# Patient Record
Sex: Male | Born: 1965
Health system: Southern US, Community
[De-identification: ages and names within clinical notes are randomized; demographics above are authoritative.]

## PROBLEM LIST (undated history)

## (undated) DIAGNOSIS — F419 Anxiety disorder, unspecified: Secondary | ICD-10-CM

## (undated) DIAGNOSIS — E785 Hyperlipidemia, unspecified: Secondary | ICD-10-CM

## (undated) DIAGNOSIS — K219 Gastro-esophageal reflux disease without esophagitis: Secondary | ICD-10-CM

## (undated) DIAGNOSIS — R51 Headache: Secondary | ICD-10-CM

## (undated) DIAGNOSIS — R519 Headache, unspecified: Secondary | ICD-10-CM

## (undated) HISTORY — DX: Anxiety disorder, unspecified: F41.9

## (undated) HISTORY — DX: Headache: R51

## (undated) HISTORY — DX: Headache, unspecified: R51.9

## (undated) HISTORY — DX: Gastro-esophageal reflux disease without esophagitis: K21.9

## (undated) HISTORY — PX: GUM SURGERY: SHX658

## (undated) HISTORY — DX: Hyperlipidemia, unspecified: E78.5

---

## 2002-08-03 ENCOUNTER — Encounter: Payer: Self-pay | Admitting: Emergency Medicine

## 2002-08-03 ENCOUNTER — Emergency Department (HOSPITAL_COMMUNITY): Admission: EM | Admit: 2002-08-03 | Discharge: 2002-08-03 | Payer: Self-pay | Admitting: Emergency Medicine

## 2003-08-26 ENCOUNTER — Emergency Department (HOSPITAL_COMMUNITY): Admission: EM | Admit: 2003-08-26 | Discharge: 2003-08-26 | Payer: Self-pay | Admitting: Emergency Medicine

## 2003-08-30 ENCOUNTER — Emergency Department (HOSPITAL_COMMUNITY): Admission: EM | Admit: 2003-08-30 | Discharge: 2003-08-30 | Payer: Self-pay | Admitting: Emergency Medicine

## 2004-02-09 ENCOUNTER — Ambulatory Visit (HOSPITAL_COMMUNITY): Admission: RE | Admit: 2004-02-09 | Discharge: 2004-02-09 | Payer: Self-pay | Admitting: Internal Medicine

## 2004-02-09 ENCOUNTER — Ambulatory Visit (HOSPITAL_COMMUNITY): Admission: RE | Admit: 2004-02-09 | Discharge: 2004-02-09 | Payer: Self-pay | Admitting: Gastroenterology

## 2005-10-01 ENCOUNTER — Encounter: Admission: RE | Admit: 2005-10-01 | Discharge: 2005-11-19 | Payer: Self-pay | Admitting: Family Medicine

## 2011-02-05 ENCOUNTER — Other Ambulatory Visit: Payer: Self-pay | Admitting: Obstetrics and Gynecology

## 2011-02-05 DIAGNOSIS — M5416 Radiculopathy, lumbar region: Secondary | ICD-10-CM

## 2011-02-07 ENCOUNTER — Ambulatory Visit
Admission: RE | Admit: 2011-02-07 | Discharge: 2011-02-07 | Disposition: A | Discharge status: Home / Self Care | Payer: BC Managed Care – PPO | Source: Ambulatory Visit | Attending: Obstetrics and Gynecology | Admitting: Obstetrics and Gynecology

## 2011-02-07 DIAGNOSIS — M5416 Radiculopathy, lumbar region: Secondary | ICD-10-CM

## 2013-03-05 ENCOUNTER — Other Ambulatory Visit: Payer: Self-pay | Admitting: Gastroenterology

## 2014-08-16 ENCOUNTER — Encounter: Payer: Self-pay | Admitting: *Deleted

## 2014-08-17 ENCOUNTER — Ambulatory Visit (INDEPENDENT_AMBULATORY_CARE_PROVIDER_SITE_OTHER): Payer: 59 | Admitting: Cardiovascular Disease

## 2014-08-17 ENCOUNTER — Encounter: Payer: Self-pay | Admitting: Cardiovascular Disease

## 2014-08-17 VITALS — BP 120/82 | HR 80 | Ht 68.5 in | Wt 188.1 lb

## 2014-08-17 DIAGNOSIS — F419 Anxiety disorder, unspecified: Secondary | ICD-10-CM

## 2014-08-17 DIAGNOSIS — R0789 Other chest pain: Secondary | ICD-10-CM

## 2014-08-17 NOTE — Patient Instructions (Addendum)
Your physician recommends that you continue on your current medications as directed. Please refer to the Current Medication list given to you today.  Your physician has requested that you have an exercise tolerance test. For further information please visit [email or web address]. Please also follow instruction sheet, as given.   Your physician recommends that you schedule a follow-up appointment in: as needed with Dr. Elease HashimotoNahser.

## 2014-08-17 NOTE — Progress Notes (Signed)
Cardiology Office Note   Date:  08/17/2014   ID:  Randy Nash, DOB 1966-02-06, MRN [id number]  PCP:   Duane Lopeoss, Alan, MD  Cardiologist:   Elease HashimotoNahser, Deloris PingPhilip J, MD   Chief Complaint  Patient presents with  . OTHER    chest pain       History of Present Illness: Randy Nash is a 49 y.o. male who presents for evaluation of chest pain.  He is typically a healthy, active man.   He went out for a bike ride during lunch.  Had severe fatigue and abdominal pain.  Also had some muscle tension  The chest tension seemed to last for about a week.  He has anxiety which seems to cause some his symptoms He rode the next weekend - 27 miles at a moderate - aggressive pace with  Rides 1000-1500 miles a year  Has had a stress test in 2006 which was normal.    He works as a Dealermechanical engineer ( designs airline seats.)    Past Medical History  Diagnosis Date  . GERD (gastroesophageal reflux disease)   . Headache   . Anxiety     Past Surgical History  Procedure Laterality Date  . Gum surgery       Current Outpatient Prescriptions  Medication Sig Dispense Refill  . Ascorbic Acid (VITAMIN C) 100 MG CHEW Chew 1 tablet by mouth daily.    Marland Kitchen. B-Complex TABS Take 1 tablet by mouth daily.    Marland Kitchen. esomeprazole (NEXIUM) 10 MG packet Take 10 mg by mouth 2 (two) times daily.    . Ginseng (ELEUTHERO PO) Take 2 capsules by mouth 2 (two) times daily.    . magnesium oxide (MAG-OX) 400 MG tablet Take 400 mg by mouth daily.    . Vitamin E 100 UNITS TABS Take 1 tablet by mouth daily.     No current facility-administered medications for this visit.    Allergies:   Review of patient's allergies indicates no known allergies.    Social History:  The patient  reports that he has never smoked. He does not have any smokeless tobacco history on file. He reports that he drinks alcohol. He reports that he does not use illicit drugs.   Family History:  The patient's family history includes Anemia in  his father; Cancer in his mother.    ROS:  Please see the history of present illness.   Otherwise, review of systems are positive for none.   All other systems are reviewed and negative.    PHYSICAL EXAM: VS:  BP 120/82 mmHg  Pulse 80  Ht 5' 8.5" (1.74 m)  Wt 188 lb 1.9 oz (85.331 kg)  BMI 28.18 kg/m2 , BMI Body mass index is 28.18 kg/(m^2). GEN: Well nourished, well developed, in no acute distress HEENT: normal Neck: no JVD, carotid bruits, or masses Cardiac: RRR; no murmurs, rubs, or gallops,no edema  Respiratory:  clear to auscultation bilaterally, normal work of breathing GI: soft, nontender, nondistended, + BS MS: no deformity or atrophy Skin: warm and dry, no rash Neuro:  Strength and sensation are intact Psych: euthymic mood, full affect   EKG:  EKG is not ordered today.    Recent Labs: No results found for requested labs within last 365 days.    Lipid Panel No results found for: CHOL, TRIG, HDL, CHOLHDL, VLDL, LDLCALC, LDLDIRECT    Wt Readings from Last 3 Encounters:  08/17/14 188 lb 1.9 oz (85.331 kg)  Other studies Reviewed: Additional studies/ records that were reviewed today include: records from Dr. Tenny Craw.. Review of the above records demonstrates:   Patient has had some CP shortly after exercise  ECG from Dr. Charlott Rakes office reviewed:  NSR , no ST or T wave changes.    ASSESSMENT AND PLAN:  1.  Atypical chest pain:  His CP is very atypical . He is an avid cyclist and rides approximately 1000 -  1500 miles per year. He was able to ride on a 27 mile bike ride a week after this episode of chest pain and he did so without any chest pain or shortness breath.  I think that his symptoms are very atypical. They're more consistent with anxiety. I will get a standard Bruce protocol GXT.   His resting ECG is normal.     I've given him my card and he will call me if he has any recurrent episodes of chest discomfort.   Current medicines are reviewed at  length with the patient today.  The patient does not have concerns regarding medicines.  The following changes have been made:  no change  Labs/ tests ordered today include:  No orders of the defined types were placed in this encounter.    Disposition:   FU with me  As needed.    Signed, Nahser, Deloris Ping, MD  08/17/2014 10:22 AM    [facility] [address] [place], [place], [place]  [address] Phone: [phone]; Fax: [phone]

## 2014-08-26 ENCOUNTER — Encounter: Payer: Self-pay | Admitting: Obstetrics and Gynecology

## 2014-10-15 ENCOUNTER — Ambulatory Visit: Payer: 59 | Admitting: Nurse Practitioner

## 2014-10-18 ENCOUNTER — Ambulatory Visit: Payer: 59 | Admitting: Physician Assistant

## 2014-11-16 ENCOUNTER — Ambulatory Visit (INDEPENDENT_AMBULATORY_CARE_PROVIDER_SITE_OTHER): Payer: 59 | Admitting: Nurse Practitioner

## 2014-11-16 ENCOUNTER — Ambulatory Visit: Payer: 59 | Admitting: Nurse Practitioner

## 2014-11-16 ENCOUNTER — Encounter: Payer: 59 | Admitting: Nurse Practitioner

## 2014-11-16 ENCOUNTER — Encounter: Payer: Self-pay | Admitting: Nurse Practitioner

## 2014-11-16 DIAGNOSIS — R0789 Other chest pain: Secondary | ICD-10-CM | POA: Diagnosis not present

## 2014-11-16 NOTE — Progress Notes (Signed)
Exercise Treadmill Test  Pre-Exercise Testing Evaluation Rhythm: normal sinus  Rate: 65 bpm     Test  Exercise Tolerance Test Ordering MD: Kristeen MissPhilip Nahser, MD  Interpreting MD: Norma FredricksonLori Gerhardt, NP  Unique Test No: 1  Treadmill:  1  Indication for ETT: chest pain - rule out ischemia  Contraindication to ETT: No   Stress Modality: exercise - treadmill  Cardiac Imaging Performed: non   Protocol: standard Bruce - maximal  Max BP:  163/79  Max MPHR (bpm):  172 85% MPR (bpm):  146  MPHR obtained (bpm):  157 % MPHR obtained:  91%  Reached 85% MPHR (min:sec):  7:35 Total Exercise Time (min-sec):  10 minutes  Workload in METS:  11.6 Borg Scale: 16  Reason ETT Terminated:  patient's desire to stop    ST Segment Analysis At Rest: normal ST segments - no evidence of significant ST depression With Exercise: no evidence of significant ST depression  Other Information Arrhythmia:  No Angina during ETT:  absent (0) Quality of ETT:  diagnostic  ETT Interpretation:  normal - no evidence of ischemia by ST analysis  Comments: Patient presents today for routine GXT. Has had atypical chest pain/fatigue. He exercises very vigorously. His symptoms have improved since he was last seen.   Today the patient exercised on the standard Bruce protocol for a total of 10 minutes.  Good exercise tolerance.  Adequate blood pressure response.  Clinically negative for chest pain. Test was stopped due to achievement of target HR/patient's desire to stop.  EKG negative for ischemia. No significant arrhythmia noted.    Recommendations: CV risk factor modification  See back prn  Patient is agreeable to this plan and will call if any problems develop in the interim.   Rosalio MacadamiaLori C. Gerhardt, RN, ANP-C [facility] [address] [place], [place]  [id number] [phone]

## 2016-09-06 DIAGNOSIS — R12 Heartburn: Secondary | ICD-10-CM | POA: Diagnosis not present

## 2016-09-06 DIAGNOSIS — Z8371 Family history of colonic polyps: Secondary | ICD-10-CM | POA: Diagnosis not present

## 2016-09-21 DIAGNOSIS — Z1211 Encounter for screening for malignant neoplasm of colon: Secondary | ICD-10-CM | POA: Diagnosis not present

## 2016-09-21 DIAGNOSIS — R12 Heartburn: Secondary | ICD-10-CM | POA: Diagnosis not present

## 2016-09-21 DIAGNOSIS — K2930 Chronic superficial gastritis without bleeding: Secondary | ICD-10-CM | POA: Diagnosis not present

## 2016-09-21 DIAGNOSIS — K3189 Other diseases of stomach and duodenum: Secondary | ICD-10-CM | POA: Diagnosis not present

## 2016-09-21 DIAGNOSIS — Z8371 Family history of colonic polyps: Secondary | ICD-10-CM | POA: Diagnosis not present

## 2016-11-12 DIAGNOSIS — Z0000 Encounter for general adult medical examination without abnormal findings: Secondary | ICD-10-CM | POA: Diagnosis not present

## 2016-11-19 DIAGNOSIS — Z0000 Encounter for general adult medical examination without abnormal findings: Secondary | ICD-10-CM | POA: Diagnosis not present

## 2017-08-15 DIAGNOSIS — H938X3 Other specified disorders of ear, bilateral: Secondary | ICD-10-CM | POA: Diagnosis not present

## 2017-08-15 DIAGNOSIS — R05 Cough: Secondary | ICD-10-CM | POA: Diagnosis not present

## 2017-08-15 DIAGNOSIS — J209 Acute bronchitis, unspecified: Secondary | ICD-10-CM | POA: Diagnosis not present

## 2017-12-24 DIAGNOSIS — Z0000 Encounter for general adult medical examination without abnormal findings: Secondary | ICD-10-CM | POA: Diagnosis not present

## 2017-12-24 DIAGNOSIS — Z13220 Encounter for screening for lipoid disorders: Secondary | ICD-10-CM | POA: Diagnosis not present

## 2017-12-27 DIAGNOSIS — Z0000 Encounter for general adult medical examination without abnormal findings: Secondary | ICD-10-CM | POA: Diagnosis not present

## 2018-02-25 ENCOUNTER — Ambulatory Visit: Payer: Self-pay | Admitting: Allergy and Immunology

## 2019-05-06 ENCOUNTER — Other Ambulatory Visit: Payer: Self-pay

## 2019-05-06 DIAGNOSIS — Z20822 Contact with and (suspected) exposure to covid-19: Secondary | ICD-10-CM

## 2019-05-07 LAB — NOVEL CORONAVIRUS, NAA: SARS-CoV-2, NAA: NOT DETECTED

## 2019-09-03 ENCOUNTER — Other Ambulatory Visit: Payer: Self-pay

## 2019-09-03 ENCOUNTER — Emergency Department (HOSPITAL_BASED_OUTPATIENT_CLINIC_OR_DEPARTMENT_OTHER)
Admission: EM | Admit: 2019-09-03 | Discharge: 2019-09-03 | Disposition: A | Discharge status: Home / Self Care | Payer: Managed Care, Other (non HMO) | Attending: Emergency Medicine | Admitting: Emergency Medicine

## 2019-09-03 ENCOUNTER — Encounter (HOSPITAL_BASED_OUTPATIENT_CLINIC_OR_DEPARTMENT_OTHER): Payer: Self-pay | Admitting: *Deleted

## 2019-09-03 ENCOUNTER — Emergency Department (HOSPITAL_BASED_OUTPATIENT_CLINIC_OR_DEPARTMENT_OTHER): Payer: Managed Care, Other (non HMO)

## 2019-09-03 DIAGNOSIS — Z79899 Other long term (current) drug therapy: Secondary | ICD-10-CM | POA: Diagnosis not present

## 2019-09-03 DIAGNOSIS — Z7982 Long term (current) use of aspirin: Secondary | ICD-10-CM | POA: Diagnosis not present

## 2019-09-03 DIAGNOSIS — U071 COVID-19: Secondary | ICD-10-CM | POA: Insufficient documentation

## 2019-09-03 DIAGNOSIS — R079 Chest pain, unspecified: Secondary | ICD-10-CM

## 2019-09-03 DIAGNOSIS — R0602 Shortness of breath: Secondary | ICD-10-CM | POA: Diagnosis present

## 2019-09-03 LAB — BASIC METABOLIC PANEL
Anion gap: 9 (ref 5–15)
BUN: 10 mg/dL (ref 6–20)
CO2: 24 mmol/L (ref 22–32)
Calcium: 8.8 mg/dL — ABNORMAL LOW (ref 8.9–10.3)
Chloride: 104 mmol/L (ref 98–111)
Creatinine, Ser: 0.94 mg/dL (ref 0.61–1.24)
GFR calc Af Amer: >60 mL/min (ref >60)
GFR calc non Af Amer: >60 mL/min (ref >60)
Glucose, Bld: 102 mg/dL — ABNORMAL HIGH (ref 70–99)
Potassium: 4.3 mmol/L (ref 3.5–5.1)
Sodium: 137 mmol/L (ref 135–145)

## 2019-09-03 LAB — CBC WITH DIFFERENTIAL/PLATELET
Abs Immature Granulocytes: 0.03 10*3/uL (ref 0.00–0.07)
Basophils Absolute: 0 10*3/uL (ref 0.0–0.1)
Basophils Relative: 0 %
Eosinophils Absolute: 0 10*3/uL (ref 0.0–0.5)
Eosinophils Relative: 0 %
HCT: 45.4 % (ref 39.0–52.0)
Hemoglobin: 14.8 g/dL (ref 13.0–17.0)
Immature Granulocytes: 1 %
Lymphocytes Relative: 19 %
Lymphs Abs: 1 10*3/uL (ref 0.7–4.0)
MCH: 24.4 pg — ABNORMAL LOW (ref 26.0–34.0)
MCHC: 32.6 g/dL (ref 30.0–36.0)
MCV: 74.9 fL — ABNORMAL LOW (ref 80.0–100.0)
Monocytes Absolute: 0.4 10*3/uL (ref 0.1–1.0)
Monocytes Relative: 7 %
Neutro Abs: 3.7 10*3/uL (ref 1.7–7.7)
Neutrophils Relative %: 73 %
Platelets: 178 10*3/uL (ref 150–400)
RBC: 6.06 MIL/uL — ABNORMAL HIGH (ref 4.22–5.81)
RDW: 13.6 % (ref 11.5–15.5)
WBC: 5.1 10*3/uL (ref 4.0–10.5)
nRBC: 0 % (ref 0.0–0.2)

## 2019-09-03 LAB — TROPONIN I (HIGH SENSITIVITY): Troponin I (High Sensitivity): <2 ng/L (ref <18)

## 2019-09-03 NOTE — ED Triage Notes (Signed)
He tested positive for Covid a week ago. He is here today for exertional SOB. He wants to make sure he does not have pneumonia.

## 2019-09-03 NOTE — ED Notes (Signed)
Urine sample at bedside

## 2019-09-03 NOTE — ED Provider Notes (Signed)
[facility] EMERGENCY DEPARTMENT Provider Note   CSN: [id number] Arrival date & time: 09/03/19  1052     History Chief Complaint  Patient presents with  . Covid Positive    Randy Nash is a 54 y.o. male with a past medical history significant for anxiety and GERD who presents to the ED due to gradual onset of worsening shortness of breath with exertion for the past few days.  Patient tested positive for Covid x 1 week ago per the patient.  Patient admits to subjective intermittent fever, dry cough, intermittent episodes of nonbloody diarrhea, and intermittent frontal headache.  Patient also admits to central, non-radiating chest pain for the past few days.  Patient notes it feels like "muscle tension".  Patient denies associative diaphoresis, nausea, and vomiting. No association with physical activity. Patient is a non-smoker.  Patient denies family history of early CAD.  Patient notes he is experiencing chest pain today and took a Klonopin which improved his symptoms.  Patient denies history of blood clots, lower extremity edema, recent surgeries, recent long immobilizations, and hormonal treatments.    Past Medical History:  Diagnosis Date  . Anxiety   . GERD (gastroesophageal reflux disease)   . Headache     Patient Active Problem List   Diagnosis Date Noted  . Atypical chest pain 08/17/2014  . Anxiety 08/17/2014    Past Surgical History:  Procedure Laterality Date  . GUM SURGERY         Family History  Problem Relation Age of Onset  . Cancer Mother   . Anemia Father     Social History   Tobacco Use  . Smoking status: Never Smoker  . Smokeless tobacco: Never Used  Substance Use Topics  . Alcohol use: Yes    Alcohol/week: 0.0 standard drinks  . Drug use: No    Home Medications Prior to Admission medications   Medication Sig Start Date End Date Taking? Authorizing Provider  Ascorbic Acid (VITAMIN C) 100 MG CHEW Chew 1 tablet by mouth daily.    Yes [provider]  aspirin EC 81 MG tablet Take 81 mg by mouth daily.   Yes [provider]  B-Complex TABS Take 1 tablet by mouth daily.   Yes [provider]  Ginseng (ELEUTHERO PO) Take 2 capsules by mouth 2 (two) times daily.   Yes [provider]  magnesium oxide (MAG-OX) 400 MG tablet Take 400 mg by mouth daily.   Yes [provider]  Multiple Vitamins-Minerals (ZINC PO) Take by mouth.   Yes [provider]  Vitamin E 100 UNITS TABS Take 1 tablet by mouth daily.   Yes [provider]  esomeprazole (NEXIUM) 10 MG packet Take 10 mg by mouth 2 (two) times daily.    [provider]    Allergies    Patient has no known allergies.  Review of Systems   Review of Systems  Constitutional: Positive for fever (subjective).  Respiratory: Positive for cough and shortness of breath.   Cardiovascular: Positive for chest pain. Negative for leg swelling.  Gastrointestinal: Positive for diarrhea. Negative for abdominal pain, nausea and vomiting.  Genitourinary: Negative for dysuria.  Neurological: Positive for headaches. Negative for numbness.  All other systems reviewed and are negative.   Physical Exam Updated Vital Signs BP (!) 134/91 (BP Location: Right Arm)   Pulse 87   Temp 98.7 F (37.1 C) (Oral)   Resp 16   SpO2 100%   Physical Exam Vitals  and nursing note reviewed.  Constitutional:      General: He is not in acute distress.    Appearance: He is not ill-appearing.  HENT:     Head: Normocephalic.  Eyes:     Conjunctiva/sclera: Conjunctivae normal.  Neck:     Comments: No meningismus.  Cardiovascular:     Rate and Rhythm: Normal rate and regular rhythm.     Pulses: Normal pulses.     Heart sounds: Normal heart sounds. No murmur. No friction rub. No gallop.   Pulmonary:     Effort: Pulmonary effort is normal.     Breath sounds: Normal breath sounds.     Comments: Respirations equal and unlabored,  patient able to speak in full sentences, lungs clear to auscultation bilaterally Chest:     Comments: No anterior chest wall tenderness.  Abdominal:     General: Abdomen is flat. Bowel sounds are normal. There is no distension.     Palpations: Abdomen is soft.     Tenderness: There is no abdominal tenderness. There is no guarding or rebound.  Musculoskeletal:     Cervical back: Neck supple.     Comments: Able to move all 4 extremities without difficulty. No lower extremity edema. Negative homans sign bilaterally.   Skin:    General: Skin is warm and dry.  Neurological:     General: No focal deficit present.     Mental Status: He is alert.  Psychiatric:        Mood and Affect: Mood normal.        Behavior: Behavior normal.     ED Results / Procedures / Treatments   Labs (all labs ordered are listed, but only abnormal results are displayed) Labs Reviewed  CBC WITH DIFFERENTIAL/PLATELET - Abnormal; Notable for the following components:      Result Value   RBC 6.06 (*)    MCV 74.9 (*)    MCH 24.4 (*)    All other components within normal limits  BASIC METABOLIC PANEL - Abnormal; Notable for the following components:   Glucose, Bld 102 (*)    Calcium 8.8 (*)    All other components within normal limits  TROPONIN I (HIGH SENSITIVITY)    EKG None  Radiology DG Chest Portable 1 View  Result Date: 09/03/2019 CLINICAL DATA:  COVID-19 1 week ago. Shortness of breath. Rule out pneumonia. EXAM: PORTABLE CHEST 1 VIEW COMPARISON:  02/09/2004 chest CT. FINDINGS: Patient rotated minimally right. Midline trachea. Normal heart size and mediastinal contours. No pleural effusion or pneumothorax. Clear lungs. The right greater than left anterior first ribs are prominent, felt to be similar to the scout of the chest CT of 02/09/2004. IMPRESSION: No acute cardiopulmonary disease. Electronically Signed   By: Jeronimo Greaves M.D.   On: 09/03/2019 11:52    Procedures Procedures (including critical  care time)  Medications Ordered in ED Medications - No data to display  ED Course  I have reviewed the triage vital signs and the nursing notes.  Pertinent labs & imaging results that were available during my care of the patient were reviewed by me and considered in my medical decision making (see chart for details).    MDM Rules/Calculators/A&P                     54 year old male presents to the ED for an evaluation of exertional shortness of breath associated with central, nonradiating chest pain for the past few days.  Per patient, he  tested positive for Covid 1 week ago.  Patient denies history of blood clots, lower extremity edema, recent surgeries, recent long immobilizations, and hormonal treatment.  Stable vitals.  Initial triage note demonstrates heart rate of 110, but during my initial evaluation heart rate was 90.  Patient in no acute distress and non-ill-appearing.  Physical exam reassuring.  Lungs clear to auscultation bilaterally.  No anterior chest wall tenderness.  No lower extremity edema.  Negative Homans sign bilaterally. Will obtain routine labs, CXR, and EKG to rule out cardiac etiology. Suspect symptoms related to COVID infection.  Personally reviewed which is negative for signs of pneumonia, pneumothorax, widened mediastinum. Patient able to ambulate in the ED and maintain O2 saturation at 98%.  CBC reassuring with no leukocytosis.  BMP with normal renal function no electrolyte abnormalities. Normal troponin. Doubt ACS. Suspect chest pain related to COVID infection. Physical exam and history not concerning for DVT/PE. Initial HR was tachycardic, but patient was not tachycardic during my exam or during any other vitals here in the ED. Discussed case with Dr. Renaye Rakers who agrees with assessment and plan. Will discharge patient home with symptomatic treatment. Advised patient to follow-up with PCP if symptoms do not improve within the next week. Strict ED precautions discussed with  patient. Patient states understanding and agrees to plan. Patient discharged home in no acute distress and stable vitals   Randy Nash was evaluated in Emergency Department on 09/03/2019 for the symptoms described in the history of present illness. He was evaluated in the context of the global COVID-19 pandemic, which necessitated consideration that the patient might be at risk for infection with the SARS-CoV-2 virus that causes COVID-19. Institutional protocols and algorithms that pertain to the evaluation of patients at risk for COVID-19 are in a state of rapid change based on information released by regulatory bodies including the CDC and federal and state organizations. These policies and algorithms were followed during the patient's care in the ED.  Final Clinical Impression(s) / ED Diagnoses Final diagnoses:  COVID-19 virus infection  Nonspecific chest pain    Rx / DC Orders ED Discharge Orders    None       Jesusita Oka 09/03/19 1535    Terald Sleeper, MD 09/03/19 [phone]

## 2019-09-03 NOTE — Discharge Instructions (Addendum)
As discussed, your labs, EKG, and chest x-ray all looked good today. I suspect your chest pain and shortness of breath are related to your COVID infection. If you symptoms do not improve within the next week, follow-up with you PCP. Return to the ER for new or worsening symptoms.

## 2019-09-03 NOTE — ED Notes (Signed)
PT ambulated with unremarkable gait with no assistance. PT held strong 98% saturation throughout ambulation. Only complaints are cough and SOB. PT noted to hold 98% saturation post ambulation as well.

## 2020-01-25 ENCOUNTER — Other Ambulatory Visit: Payer: Self-pay

## 2020-01-27 NOTE — Progress Notes (Signed)
Cardiology Office Note:   Date:  01/28/2020  NAME:  Randy Nash    MRN: [id number] DOB:  [birth year]   PCP:  Daisy Floro, MD  Cardiologist:  No primary care provider on file.   Referring MD: Daisy Floro, MD   Chief Complaint  Patient presents with  . New Patient (Initial Visit)   History of Present Illness:   Randy Nash is a 54 y.o. male with a hx of GERD who is being seen today for the evaluation of family history of CAD at the request of Daisy Floro, MD.  He presents for evaluation of family history of CAD.  He reports his father died suddenly from a cardiomyopathy.  He also reports his sister died at the age of 51 in her house.  Presumptively she had a massive heart attack.  He reports that his father died age 70.  He has a history of acid reflux.  He is a avid cyclist.  He does cycle 80 miles per week.  On Saturday he did 60 miles.  He denies any chest pain or shortness of breath when he exerts himself.  He reports he is overall in good health.  We did review his cholesterol which is marginal.  He does not take any cholesterol medication at this time.  He does inquire about further stratification.  I think a calcium score would be appropriate.  His 10-year risk was 4.8% which is low risk.  He has never had a heart attack or stroke.  He is a never smoker.  He consumes alcohol in moderation.  No illicit drug use.  He does work as a Comptroller.  His EKG is normal.  He has no evidence of heart failure or heart murmurs on exam.  Labs her primary care physician: Creatinine 1.02, total cholesterol 169, HDL 47, LDL 109, triglycerides 70  Past Medical History: Past Medical History:  Diagnosis Date  . Anxiety   . GERD (gastroesophageal reflux disease)   . Headache     Past Surgical History: Past Surgical History:  Procedure Laterality Date  . GUM SURGERY      Current Medications: Current Meds  Medication Sig  . Ascorbic Acid (VITAMIN C) 100 MG  CHEW Chew 1 tablet by mouth daily.  Marland Kitchen aspirin EC 81 MG tablet Take 81 mg by mouth daily.  Marland Kitchen B-Complex TABS Take 1 tablet by mouth daily.  . clonazePAM (KLONOPIN) 0.5 MG tablet Take 0.5 mg by mouth at bedtime.  . Ginseng (ELEUTHERO PO) Take 2 capsules by mouth 2 (two) times daily.  . magnesium oxide (MAG-OX) 400 MG tablet Take 400 mg by mouth daily.  . Multiple Vitamins-Minerals (ZINC PO) Take by mouth.  . Vitamin E 100 UNITS TABS Take 1 tablet by mouth daily.     Allergies:    Patient has no known allergies.   Social History: Social History   Socioeconomic History  . Marital status: Married    Spouse name: elaine  . Number of children: 3  . Years of education: college  . Highest education level: Not on file  Occupational History  . 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 Smoking status: Never Smoker  . Smokeless tobacco: Never Used  Substance and Sexual Activity  . Alcohol use: Yes    Alcohol/week: 0.0 standard drinks  . Drug use: No  . Sexual activity: Not on file  Other Topics Concern  . Not on file  Social History Narrative  .  Not on file   Social Determinants of Health   Financial Resource Strain:   . Difficulty of Paying Living Expenses:   Food Insecurity:   . Worried About Programme researcher, broadcasting/film/video in the Last Year:   . Barista in the Last Year:   Transportation Needs:   . Freight forwarder (Medical):   Marland Kitchen Lack of Transportation (Non-Medical):   Physical Activity:   . Days of Exercise per Week:   . Minutes of Exercise per Session:   Stress:   . Feeling of Stress :   Social Connections:   . Frequency of Communication with Friends and Family:   . Frequency of Social Gatherings with Friends and Family:   . Attends Religious Services:   . Active Member of Clubs or Organizations:   . Attends Banker Meetings:   Marland Kitchen Marital Status:      Family History: The patient's family history includes Anemia in his father; Cancer in his mother;  Heart attack (age of onset: 34) in his sister; Heart failure (age of onset: 34) in his father.  ROS:   All other ROS reviewed and negative. Pertinent positives noted in the HPI.     EKGs/Labs/Other Studies Reviewed:   The following studies were personally reviewed by me today:  EKG:  EKG is ordered today.  The ekg ordered today demonstrates normal sinus rhythm, heart is 61, no acute ST-T changes, no evidence of prior infarction, and was personally reviewed by me.   Recent Labs: 09/03/2019: BUN 10; Creatinine, Ser 0.94; Hemoglobin 14.8; Platelets 178; Potassium 4.3; Sodium 137   Recent Lipid Panel No results found for: CHOL, TRIG, HDL, CHOLHDL, VLDL, LDLCALC, LDLDIRECT  Physical Exam:   VS:  BP 134/82   Pulse 61   Ht 5' 8.5" (1.74 m)   Wt 202 lb (91.6 kg)   BMI 30.27 kg/m    Wt Readings from Last 3 Encounters:  01/28/20 202 lb (91.6 kg)  08/17/14 188 lb 1.9 oz (85.3 kg)    General: Well nourished, well developed, in no acute distress Heart: Atraumatic, normal size  Eyes: PEERLA, EOMI  Neck: Supple, no JVD Endocrine: No thryomegaly Cardiac: Normal S1, S2; RRR; no murmurs, rubs, or gallops Lungs: Clear to auscultation bilaterally, no wheezing, rhonchi or rales  Abd: Soft, nontender, no hepatomegaly  Ext: No edema, pulses 2+ Musculoskeletal: No deformities, BUE and BLE strength normal and equal Skin: Warm and dry, no rashes   Neuro: Alert and oriented to person, place, time, and situation, CNII-XII grossly intact, no focal deficits  Psych: Normal mood and affect   ASSESSMENT:   Randy Nash is a 54 y.o. male who presents for the following: 1. Family history of heart disease     PLAN:   1. Family history of heart disease -10-year ASCVD risk score is 4.6%.  This is low risk.  He does have a family history of heart disease in his father and sister.  He has no symptoms of chest pain or shortness of breath.  He is an avid cyclist.  He can complete a high level of activity  without any limitations.  I have recommended coronary calcium score.  This will further risk ratified him.  It also let us know if his cholesterol is acceptable.  We will plan to do this and then follow with him by phone.  Disposition: Return if symptoms worsen or fail to improve.  Medication Adjustments/Labs and Tests Ordered: Current medicines are reviewed  at length with the patient today.  Concerns regarding medicines are outlined above.  Orders Placed This Encounter  Procedures  . CT CARDIAC SCORING  . EKG 12-Lead   No orders of the defined types were placed in this encounter.   Patient Instructions  Medication Instructions:  The current medical regimen is effective;  continue present plan and medications.  *If you need a refill on your cardiac medications before your next appointment, please call your pharmacy*   Testing/Procedures: CALCIUM SCORE   Follow-Up: At [facility], you and your health needs are our priority.  As part of our continuing mission to provide you with exceptional heart care, we have created designated Provider Care Teams.  These Care Teams include your primary Cardiologist (physician) and Advanced Practice Providers (APPs -  Physician Assistants and Nurse Practitioners) who all work together to provide you with the care you need, when you need it.  We recommend signing up for the patient portal called "MyChart".  Sign up information is provided on this After Visit Summary.  MyChart is used to connect with patients for Virtual Visits (Telemedicine).  Patients are able to view lab/test results, encounter notes, upcoming appointments, etc.  Non-urgent messages can be sent to your provider as well.   To learn more about what you can do with MyChart, go to [email or web address].    Your next appointment:   As needed  The format for your next appointment:   In Person  Provider:   Lennie Odor, MD         Signed, Lenna Gilford. Flora Lipps, MD [facility]  [address], [address] [place], [place] [address] [phone]  01/28/2020 1:57 PM

## 2020-01-28 ENCOUNTER — Encounter: Payer: Self-pay | Admitting: Cardiovascular Disease

## 2020-01-28 ENCOUNTER — Ambulatory Visit (INDEPENDENT_AMBULATORY_CARE_PROVIDER_SITE_OTHER): Payer: Managed Care, Other (non HMO) | Admitting: Cardiovascular Disease

## 2020-01-28 ENCOUNTER — Other Ambulatory Visit: Payer: Self-pay

## 2020-01-28 VITALS — BP 134/82 | HR 61 | Ht 68.5 in | Wt 202.0 lb

## 2020-01-28 DIAGNOSIS — Z8249 Family history of ischemic heart disease and other diseases of the circulatory system: Secondary | ICD-10-CM | POA: Diagnosis not present

## 2020-01-28 NOTE — Patient Instructions (Signed)
Medication Instructions:  The current medical regimen is effective;  continue present plan and medications.  *If you need a refill on your cardiac medications before your next appointment, please call your pharmacy*   Testing/Procedures: CALCIUM SCORE   Follow-Up: At [facility], you and your health needs are our priority.  As part of our continuing mission to provide you with exceptional heart care, we have created designated Provider Care Teams.  These Care Teams include your primary Cardiologist (physician) and Advanced Practice Providers (APPs -  Physician Assistants and Nurse Practitioners) who all work together to provide you with the care you need, when you need it.  We recommend signing up for the patient portal called "MyChart".  Sign up information is provided on this After Visit Summary.  MyChart is used to connect with patients for Virtual Visits (Telemedicine).  Patients are able to view lab/test results, encounter notes, upcoming appointments, etc.  Non-urgent messages can be sent to your provider as well.   To learn more about what you can do with MyChart, go to [email or web address].    Your next appointment:   As needed  The format for your next appointment:   In Person  Provider:   Kearny O'Neal, MD     

## 2020-02-04 ENCOUNTER — Other Ambulatory Visit: Payer: Self-pay

## 2020-02-04 ENCOUNTER — Ambulatory Visit (INDEPENDENT_AMBULATORY_CARE_PROVIDER_SITE_OTHER)
Admission: RE | Admit: 2020-02-04 | Discharge: 2020-02-04 | Disposition: A | Discharge status: Home / Self Care | Payer: Self-pay | Source: Ambulatory Visit | Attending: Cardiovascular Disease | Admitting: Cardiovascular Disease

## 2020-02-04 DIAGNOSIS — Z8249 Family history of ischemic heart disease and other diseases of the circulatory system: Secondary | ICD-10-CM

## 2020-03-08 NOTE — Progress Notes (Signed)
Virtual Visit via Telephone Note   This visit type was conducted due to national recommendations for restrictions regarding the COVID-19 Pandemic (e.g. social distancing) in an effort to limit this patient's exposure and mitigate transmission in our community.  Due to his co-morbid illnesses, this patient is at least at moderate risk for complications without adequate follow up.  This format is felt to be most appropriate for this patient at this time.  The patient did not have access to video technology/had technical difficulties with video requiring transitioning to audio format only (telephone).  All issues noted in this document were discussed and addressed.  No physical exam could be performed with this format.  Please refer to the patient's chart for his  consent to telehealth for [facility].   Date:  03/10/2020   ID:  Randy Nash, DOB Oct 20, 1965, MRN [id number] The patient was identified using 2 identifiers.  Patient Location: Home Provider Location: Office/Clinic  PCP:  Daisy Floro, MD  Cardiologist:  No primary care provider on file.   Evaluation Performed:  Follow-Up Visit  Chief Complaint:  CAD  History of Present Illness:    Randy Nash is a 54 y.o. male with history of GERD who presents to discuss CT CAC score. Recently evaluated due to family history of CAD. No symptoms so we pursued a coronary calcium score that shows he is in the 60th percentile. No symptoms. Rides a bike up to 20+ miles without angina. No increased SOB.  We discussed extensively what his coronary calcium score means in his 60th percentile score.  I did inform him that it is reasonable to continue with dietary and exercise modifications.  The decision for statin is based on the 75th percentile.  At this time he is reported that he is interested in diet and exercise modification at this time.  Overall he reports no major symptoms such as chest pain, shortness of breath or palpitations.   He seems to be doing well.  Problem List 1. CAD -CAC score 16 (60th percentile) -T chol 169, HDL 47, LDL 109, TG 70  The patient does not have symptoms concerning for COVID-19 infection (fever, chills, cough, or new shortness of breath).   Past Medical History:  Diagnosis Date   Anxiety    GERD (gastroesophageal reflux disease)    Headache    Past Surgical History:  Procedure Laterality Date   GUM SURGERY       Current Meds  Medication Sig   Ascorbic Acid (VITAMIN C) 100 MG CHEW Chew 1 tablet by mouth daily.   aspirin EC 81 MG tablet Take 81 mg by mouth daily.   B-Complex TABS Take 1 tablet by mouth daily.   clonazePAM (KLONOPIN) 0.5 MG tablet Take 0.5 mg by mouth at bedtime.   Ginseng (ELEUTHERO PO) Take 2 capsules by mouth 2 (two) times daily.   magnesium oxide (MAG-OX) 400 MG tablet Take 400 mg by mouth daily.   Multiple Vitamins-Minerals (ZINC PO) Take by mouth.   Vitamin E 100 UNITS TABS Take 1 tablet by mouth daily.     Allergies:   Patient has no known allergies.   Social History   Tobacco Use   Smoking status: Never Smoker   Smokeless tobacco: Never Used  Substance Use Topics   Alcohol use: Yes    Alcohol/week: 0.0 standard drinks   Drug use: No     Family Hx: The patient's family history includes Anemia in his father; Cancer in his  mother; Heart attack (age of onset: 68) in his sister; Heart failure (age of onset: 41) in his father.  ROS:   Please see the history of present illness.     All other systems reviewed and are negative.   Prior CV studies:   The following studies were reviewed today:  CT CAC score 02/05/2020 Coronary calcium score of 16. This was 60th percentile for age and sex matched controls.  Labs/Other Tests and Data Reviewed:    EKG:  No ECG reviewed.  Recent Labs: 09/03/2019: BUN 10; Creatinine, Ser 0.94; Hemoglobin 14.8; Platelets 178; Potassium 4.3; Sodium 137   Recent Lipid Panel No results found for: CHOL,  TRIG, HDL, CHOLHDL, LDLCALC, LDLDIRECT  Wt Readings from Last 3 Encounters:  01/28/20 202 lb (91.6 kg)  08/17/14 188 lb 1.9 oz (85.3 kg)     Objective:    Vital Signs:  There were no vitals taken for this visit.   VITAL SIGNS:  reviewed  Gen: NAD Pulm: No SOB by phone  Psych: normal mood affect  ASSESSMENT & PLAN:    1. Family history of heart disease 2. Coronary artery disease involving native coronary artery of native heart without angina pectoris -EKG normal.  No symptoms of chest pain.  Coronary calcium score 16, this is 60th percentile.  We discussed that his most recent LDL cholesterol is 107.  My decision tree for starting statin therapy 75th percentile.  He is not there.  He reports he would like to continue with dietary and exercise modification for now.  I think this is reasonable.  He reports he will follow-up with his primary care physician on a yearly basis and if he has any change in symptoms he will see [place] back.  For now he would like to proceed with diet and exercise.  Again, this is reasonable.   COVID-19 Education: The signs and symptoms of COVID-19 were discussed with the patient and how to seek care for testing (follow up with PCP or arrange E-visit).  The importance of social distancing was discussed today.  Time:  Today, I have spent 15 minutes with the patient with telehealth technology discussing the above problems.     Medication Adjustments/Labs and Tests Ordered: Current medicines are reviewed at length with the patient today.  Concerns regarding medicines are outlined above.   Tests Ordered: No orders of the defined types were placed in this encounter.   Medication Changes: No orders of the defined types were placed in this encounter.   Follow Up:  PRN   Signed, Reatha Harps, MD  03/10/2020 3:36 PM    [facility]

## 2020-03-10 ENCOUNTER — Telehealth (INDEPENDENT_AMBULATORY_CARE_PROVIDER_SITE_OTHER): Payer: Managed Care, Other (non HMO) | Admitting: Cardiovascular Disease

## 2020-03-10 ENCOUNTER — Encounter: Payer: Self-pay | Admitting: Cardiovascular Disease

## 2020-03-10 DIAGNOSIS — I2510 Atherosclerotic heart disease of native coronary artery without angina pectoris: Secondary | ICD-10-CM | POA: Diagnosis not present

## 2020-03-10 DIAGNOSIS — Z8249 Family history of ischemic heart disease and other diseases of the circulatory system: Secondary | ICD-10-CM

## 2020-03-10 NOTE — Patient Instructions (Signed)
Medication Instructions:  The current medical regimen is effective;  continue present plan and medications.  *If you need a refill on your cardiac medications before your next appointment, please call your pharmacy*    Follow-Up: At [facility], you and your health needs are our priority.  As part of our continuing mission to provide you with exceptional heart care, we have created designated Provider Care Teams.  These Care Teams include your primary Cardiologist (physician) and Advanced Practice Providers (APPs -  Physician Assistants and Nurse Practitioners) who all work together to provide you with the care you need, when you need it.  We recommend signing up for the patient portal called "MyChart".  Sign up information is provided on this After Visit Summary.  MyChart is used to connect with patients for Virtual Visits (Telemedicine).  Patients are able to view lab/test results, encounter notes, upcoming appointments, etc.  Non-urgent messages can be sent to your provider as well.   To learn more about what you can do with MyChart, go to [email or web address].    Your next appointment:   As needed  The format for your next appointment:   In Person  Provider:   [place] O'Neal, MD      

## 2020-10-10 IMAGING — CT CT CARDIAC CORONARY ARTERY CALCIUM SCORE
3 series · 14 of 20 positions shown, 15 images · non-contrast
Comparison: CT 02/09/2004

Addendum:
CLINICAL DATA: Risk stratification

EXAM:
Coronary Calcium Score
TECHNIQUE: The patient was scanned on a Siemens Force scanner. Axial
non-contrast 3 mm slices were carried out through the heart. The
data set was analyzed on a dedicated work station and scored using
the Agatson method.

[Series 2: casc 3.0 bv41 2 bestdiast 68 % · axial · 0.37mm/px · z∈[-227,-155]mm · 4 of 40 slices shown, 5 images]
[im 8/40  vessel]
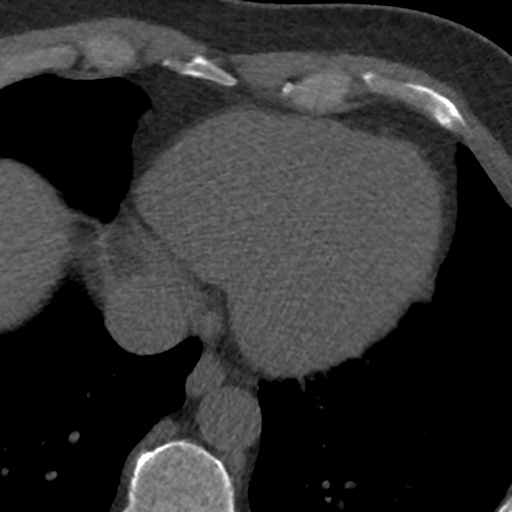
[im 8/40  lung]
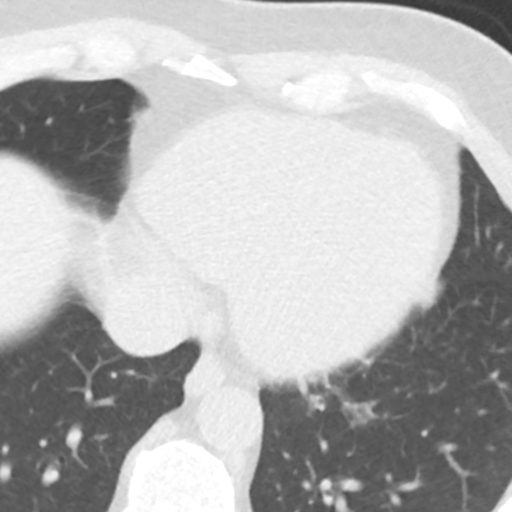
[im 16/40  vessel]
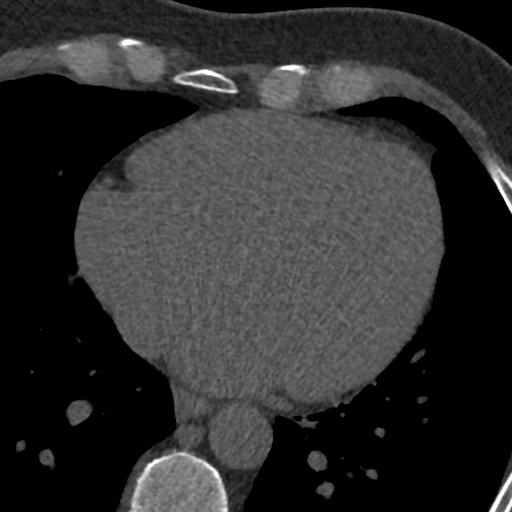
[im 24/40  vessel]
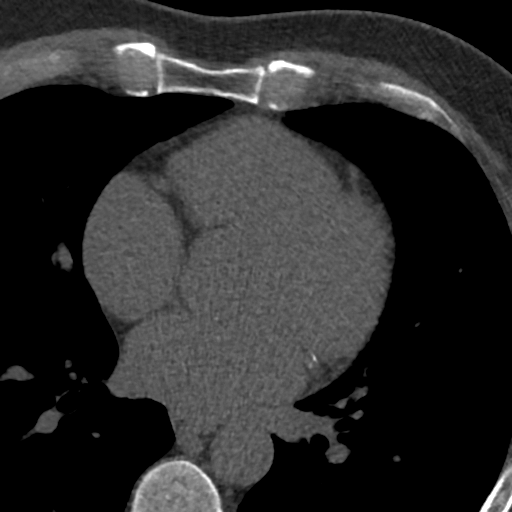
[im 32/40  vessel]
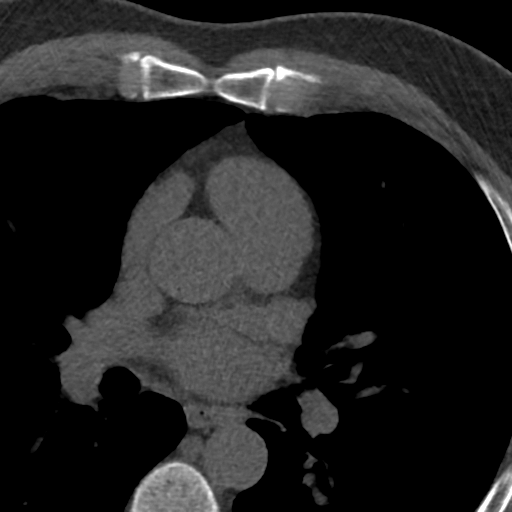

[Series 3: lung 69 % · axial · 0.68mm/px · z∈[-230,-152]mm · 5 of 40 slices shown]
[im 7/40  lung]
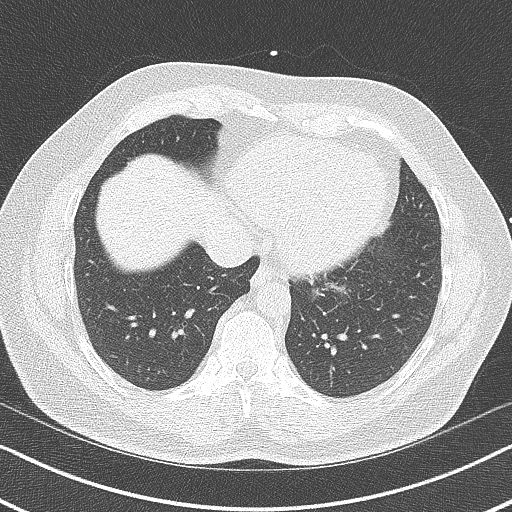
[im 14/40  lung]
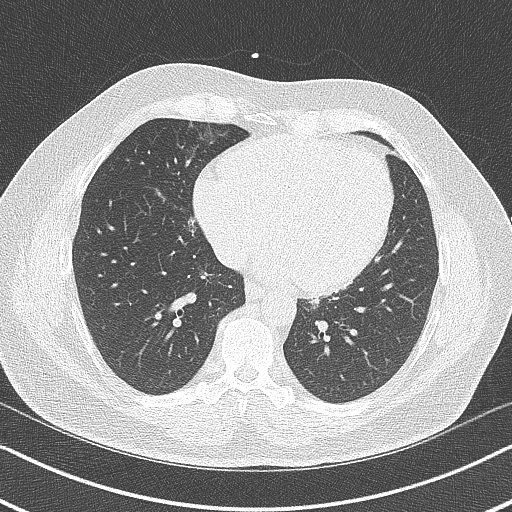
[im 20/40  lung]
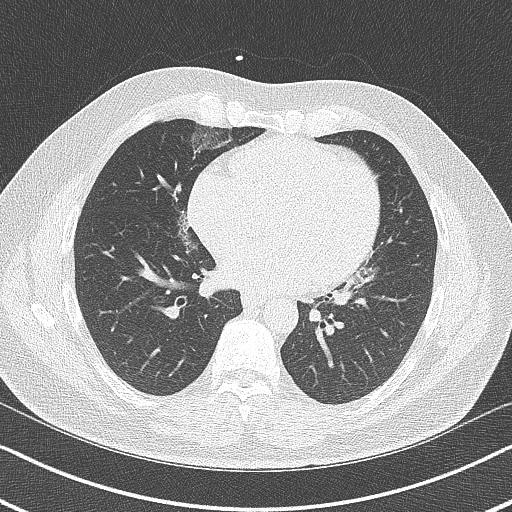
[im 27/40  lung]
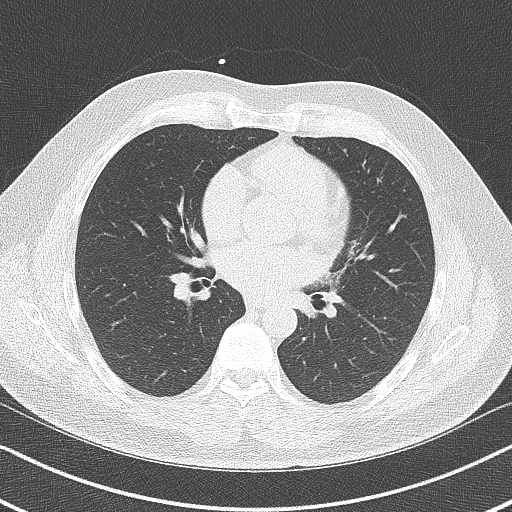
[im 33/40  lung]
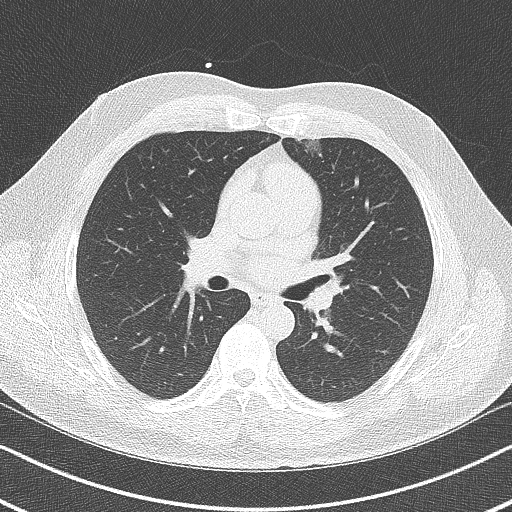

[Series 4: lung st 69 % · axial · 0.68mm/px · z∈[-230,-152]mm · 5 of 40 slices shown]
[im 7/40  lung]
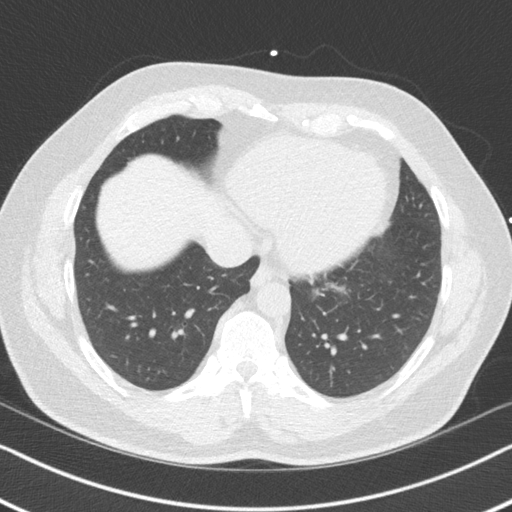
[im 14/40  lung]
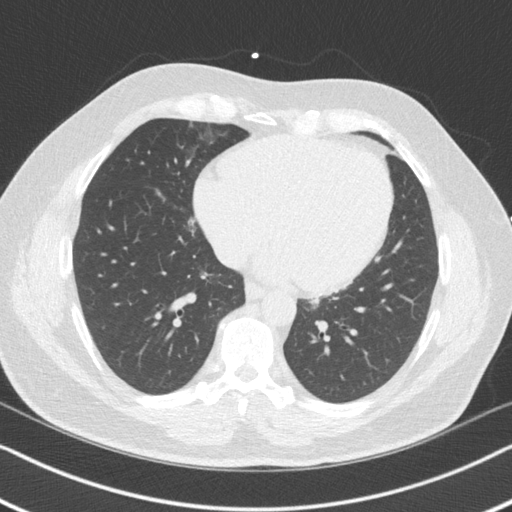
[im 20/40  lung]
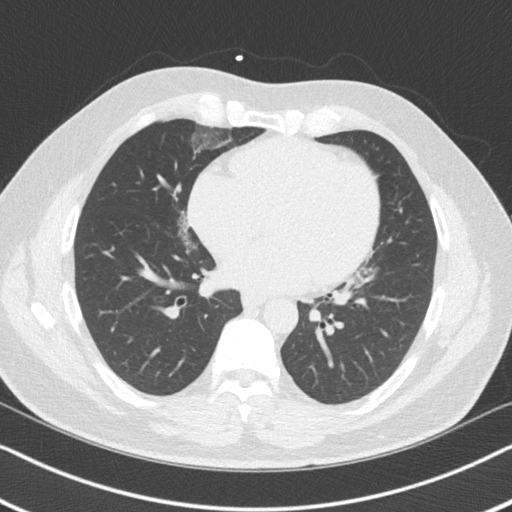
[im 27/40  lung]
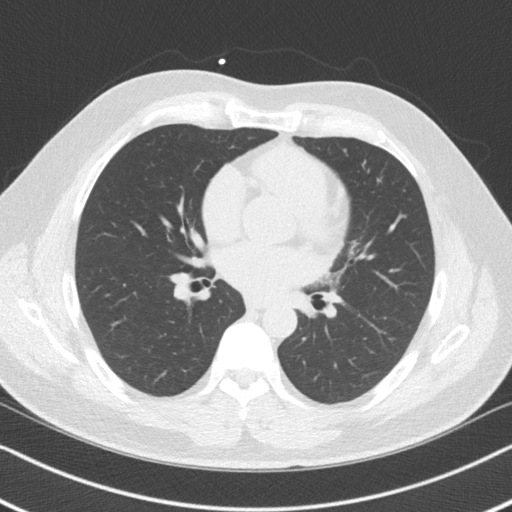
[im 33/40  lung]
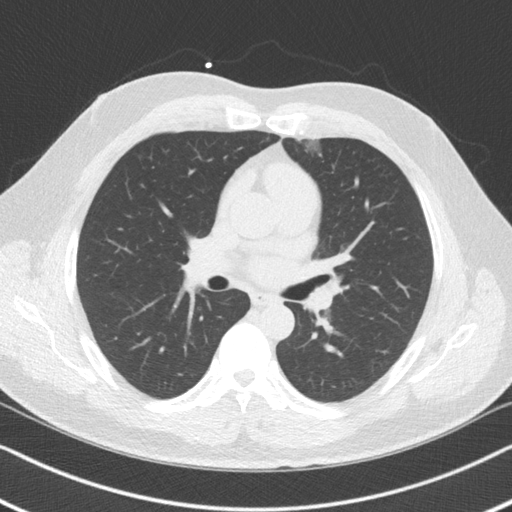

[14 of 20 positions shown; findings below may reference images not displayed]

FINDINGS: Non-cardiac: See separate report from [REDACTED].

Ascending Aorta: Normal caliber.

Pericardium: Normal.

Coronary arteries: Normal origins.
IMPRESSION: Coronary calcium score of 16. This was 60th percentile for age and
sex matched controls.

EXAM:
OVER-READ INTERPRETATION  CT CHEST

The following report is an over-read performed by radiologist Dr.
over-read does not include interpretation of cardiac or coronary
anatomy or pathology. The calcium cardiac interpretation by the
cardiologist is attached.
FINDINGS: Limited view of the lung parenchyma demonstrates mild ground-glass
opacities in the medial aspect of the lingula and RIGHT middle
lobe. Airways are normal.

Limited view of the mediastinum demonstrates no adenopathy.
Esophagus normal.

Limited view of the upper abdomen unremarkable.

Limited view of the skeleton and chest wall is unremarkable.
IMPRESSION: Mild ground-glass opacities in the medial RIGHT middle lobe and
lingula. Nonspecific finding likely representing mild post
infectious or inflammatory process.

*** End of Addendum ***
FINDINGS: Non-cardiac: See separate report from [REDACTED].

Ascending Aorta: Normal caliber.

Pericardium: Normal.

Coronary arteries: Normal origins.
IMPRESSION: Coronary calcium score of 16. This was 60th percentile for age and
sex matched controls.

## 2020-12-30 DIAGNOSIS — Z125 Encounter for screening for malignant neoplasm of prostate: Secondary | ICD-10-CM | POA: Diagnosis not present

## 2020-12-30 DIAGNOSIS — Z0000 Encounter for general adult medical examination without abnormal findings: Secondary | ICD-10-CM | POA: Diagnosis not present

## 2020-12-30 DIAGNOSIS — Z13220 Encounter for screening for lipoid disorders: Secondary | ICD-10-CM | POA: Diagnosis not present

## 2020-12-30 DIAGNOSIS — Z23 Encounter for immunization: Secondary | ICD-10-CM | POA: Diagnosis not present

## 2021-03-06 DIAGNOSIS — L918 Other hypertrophic disorders of the skin: Secondary | ICD-10-CM | POA: Diagnosis not present

## 2021-04-27 DIAGNOSIS — F411 Generalized anxiety disorder: Secondary | ICD-10-CM | POA: Diagnosis not present

## 2021-04-27 DIAGNOSIS — R062 Wheezing: Secondary | ICD-10-CM | POA: Diagnosis not present

## 2021-04-27 DIAGNOSIS — Z23 Encounter for immunization: Secondary | ICD-10-CM | POA: Diagnosis not present

## 2021-04-27 DIAGNOSIS — R030 Elevated blood-pressure reading, without diagnosis of hypertension: Secondary | ICD-10-CM | POA: Diagnosis not present

## 2021-05-08 DIAGNOSIS — U071 COVID-19: Secondary | ICD-10-CM | POA: Diagnosis not present

## 2021-05-27 ENCOUNTER — Emergency Department (HOSPITAL_BASED_OUTPATIENT_CLINIC_OR_DEPARTMENT_OTHER): Payer: BC Managed Care – PPO

## 2021-05-27 ENCOUNTER — Emergency Department (HOSPITAL_BASED_OUTPATIENT_CLINIC_OR_DEPARTMENT_OTHER)
Admission: EM | Admit: 2021-05-27 | Discharge: 2021-05-27 | Disposition: A | Discharge status: Home / Self Care | Payer: BC Managed Care – PPO | Attending: Emergency Medicine | Admitting: Emergency Medicine

## 2021-05-27 ENCOUNTER — Encounter (HOSPITAL_BASED_OUTPATIENT_CLINIC_OR_DEPARTMENT_OTHER): Payer: Self-pay

## 2021-05-27 ENCOUNTER — Other Ambulatory Visit: Payer: Self-pay

## 2021-05-27 DIAGNOSIS — K219 Gastro-esophageal reflux disease without esophagitis: Secondary | ICD-10-CM | POA: Insufficient documentation

## 2021-05-27 DIAGNOSIS — R1013 Epigastric pain: Secondary | ICD-10-CM | POA: Insufficient documentation

## 2021-05-27 DIAGNOSIS — R5383 Other fatigue: Secondary | ICD-10-CM | POA: Insufficient documentation

## 2021-05-27 DIAGNOSIS — R0789 Other chest pain: Secondary | ICD-10-CM | POA: Diagnosis not present

## 2021-05-27 LAB — BASIC METABOLIC PANEL
Anion gap: 8 (ref 5–15)
BUN: 15 mg/dL (ref 6–20)
CO2: 27 mmol/L (ref 22–32)
Calcium: 9 mg/dL (ref 8.9–10.3)
Chloride: 104 mmol/L (ref 98–111)
Creatinine, Ser: 1.09 mg/dL (ref 0.61–1.24)
GFR, Estimated: >60 mL/min (ref >60)
Glucose, Bld: 92 mg/dL (ref 70–99)
Potassium: 3.7 mmol/L (ref 3.5–5.1)
Sodium: 139 mmol/L (ref 135–145)

## 2021-05-27 LAB — CBC
HCT: 41.5 % (ref 39.0–52.0)
Hemoglobin: 13.8 g/dL (ref 13.0–17.0)
MCH: 24.7 pg — ABNORMAL LOW (ref 26.0–34.0)
MCHC: 33.3 g/dL (ref 30.0–36.0)
MCV: 74.2 fL — ABNORMAL LOW (ref 80.0–100.0)
Platelets: 207 10*3/uL (ref 150–400)
RBC: 5.59 MIL/uL (ref 4.22–5.81)
RDW: 14.6 % (ref 11.5–15.5)
WBC: 6.5 10*3/uL (ref 4.0–10.5)
nRBC: 0 % (ref 0.0–0.2)

## 2021-05-27 LAB — TROPONIN I (HIGH SENSITIVITY)
Troponin I (High Sensitivity): 5 ng/L (ref <18)
Troponin I (High Sensitivity): 6 ng/L (ref <18)

## 2021-05-27 MED ORDER — FAMOTIDINE 20 MG PO TABS: 20.0000 mg | ORAL_TABLET | Freq: Two times a day (BID) | ORAL | 2 refills | Status: AC

## 2021-05-27 NOTE — ED Triage Notes (Signed)
Pt is present for intermittent chest "discomfort" x 4-6 months. Pt states earlier this afternoon he had a short episode of feeling weak and sweaty with chest discomfort. Sx resolved with antacids. Pt states the chest discomfort is not painful but states, "something just doesn't feel right". Pt is unsure if it is anxiety or if it may be something with acid reflux, or occasional high BP readings at home. Highest reading 160/100. Pt also had COVID at the beginning of October and noticed that albuterol improves his chest discomfort as well, but causes him anxiety. Pt is very active and rode 20 miles on his bike yesterday.   Pt states currently he does not have chest discomfort but still feeling a little weak.

## 2021-05-27 NOTE — ED Provider Notes (Signed)
[facility] EMERGENCY DEPT Provider Note   CSN: [id number] Arrival date & time: 05/27/21  1848     History Chief Complaint  Patient presents with   Chest Pain    Randy Nash is a 55 y.o. male with a history of reflux presented emergency department with feeling fatigued and hot.  The patient reports that he was sitting after dinner with his family between 5 and 6 PM this evening, and suddenly "just did not feel right".  He describes a feeling that he was overheated.  He reports a history of anxiety and felt he was also quite anxious during his episode.  He noticed some chest discomfort and tightness, took some Tums because he does suffer from reflux, and had some relief of the symptoms.  He also took 4 baby aspirin.  He then came to the ED.    He says he has had similar episodes in the past, which can occur at random.  Sometimes they do occur when he is on long bike rides, as he is quite physically active.  He reports he will occasionally get a feeling of tightness around his throat.  He sometimes gets some pressure in his epigastrium that radiates towards his throat.  He has been seen by cardiologist in the past.  Per medical record review, his last office visit with a cardiologist was approximately 1 year ago in August 2021.  At that time was noted to have atypical chest pain.  He had a coronary CT scan done in the same year, which gave him a calcium score of 16, in 60th percentile for age.  He has not had any cardiac imaging since then.  Since arriving in the ED reports he feels much better.  He denies any difficulty breathing.  He reports perhaps a very minimal chest discomfort but no significant symptoms.  He does suffer from reflux often.  He has not tried chronic medications for acid suppression.    He reports that he does not take any medications except multivitamins.  He denies history of diabetes, persistent hypertension, high cholesterol, or smoking.  He does  report a family history of MI in his sister (suspected) in her early 31's.  HPI     Past Medical History:  Diagnosis Date   Anxiety    GERD (gastroesophageal reflux disease)    Headache     Patient Active Problem List   Diagnosis Date Noted   Atypical chest pain 08/17/2014   Anxiety 08/17/2014    Past Surgical History:  Procedure Laterality Date   GUM SURGERY         Family History  Problem Relation Age of Onset   Cancer Mother    Anemia Father    Heart failure Father 68   Heart attack Sister 56    Social History   Tobacco Use   Smoking status: Never   Smokeless tobacco: Never  Substance Use Topics   Alcohol use: Yes    Alcohol/week: 0.0 standard drinks   Drug use: No    Home Medications Prior to Admission medications   Medication Sig Start Date End Date Taking? Authorizing Provider  famotidine (PEPCID) 20 MG tablet Take 1 tablet (20 mg total) by mouth 2 (two) times daily. Take 30 minutes before breakfast and dinner 05/27/21 06/26/21 Yes Kamilla Hands, Kermit Balo, MD  Ascorbic Acid (VITAMIN C) 100 MG CHEW Chew 1 tablet by mouth daily.    [provider]  aspirin EC 81 MG tablet Take 81 mg  by mouth daily.    [provider]  B-Complex TABS Take 1 tablet by mouth daily.    [provider]  clonazePAM (KLONOPIN) 0.5 MG tablet Take 0.5 mg by mouth at bedtime. 08/24/19   [provider]  Ginseng (ELEUTHERO PO) Take 2 capsules by mouth 2 (two) times daily.    [provider]  magnesium oxide (MAG-OX) 400 MG tablet Take 400 mg by mouth daily.    [provider]  Multiple Vitamins-Minerals (ZINC PO) Take by mouth.    [provider]  Vitamin E 100 UNITS TABS Take 1 tablet by mouth daily.    [provider]    Allergies    Patient has no known allergies.  Review of Systems   Review of Systems  Constitutional:  Negative for chills and fever.  HENT:  Negative for ear pain and sore throat.   Eyes:   Negative for pain and visual disturbance.  Respiratory:  Positive for chest tightness. Negative for cough and shortness of breath.   Cardiovascular:  Positive for chest pain. Negative for palpitations.  Gastrointestinal:  Negative for abdominal pain and vomiting.  Genitourinary:  Negative for dysuria and hematuria.  Musculoskeletal:  Negative for arthralgias and back pain.  Skin:  Negative for color change and rash.  Neurological:  Negative for syncope and headaches.  All other systems reviewed and are negative.  Physical Exam Updated Vital Signs BP 134/87   Pulse 65   Temp 98.6 F (37 C)   Resp 14   Ht 5\' 8"  (1.727 m)   Wt 90.7 kg   SpO2 99%   BMI 30.41 kg/m   Physical Exam Constitutional:      General: He is not in acute distress. HENT:     Head: Normocephalic and atraumatic.  Eyes:     Conjunctiva/sclera: Conjunctivae normal.     Pupils: Pupils are equal, round, and reactive to light.  Cardiovascular:     Rate and Rhythm: Normal rate and regular rhythm.  Pulmonary:     Effort: Pulmonary effort is normal. No respiratory distress.  Abdominal:     General: There is no distension.     Tenderness: There is no abdominal tenderness.  Skin:    General: Skin is warm and dry.  Neurological:     General: No focal deficit present.     Mental Status: He is alert. Mental status is at baseline.  Psychiatric:        Mood and Affect: Mood normal.        Behavior: Behavior normal.    ED Results / Procedures / Treatments   Labs (all labs ordered are listed, but only abnormal results are displayed) Labs Reviewed  CBC - Abnormal; Notable for the following components:      Result Value   MCV 74.2 (*)    MCH 24.7 (*)    All other components within normal limits  BASIC METABOLIC PANEL  TROPONIN I (HIGH SENSITIVITY)  TROPONIN I (HIGH SENSITIVITY)    EKG EKG Interpretation  Date/Time:  Saturday May 27 2021 19:08:48 EDT Ventricular Rate:  72 PR Interval:  168 QRS  Duration: 90 QT Interval:  378 QTC Calculation: 413 R Axis:   76 Text Interpretation: Normal sinus rhythm Nonspecific T wave abnormality Abnormal ECG Confirmed by [id number] [phone]) on 05/27/2021 8:52:59 PM  Radiology DG Chest Portable 1 View  Result Date: 05/27/2021 CLINICAL DATA:  Chest discomfort. EXAM: PORTABLE CHEST 1 VIEW COMPARISON:  September 03, 2019  FINDINGS: The heart size and mediastinal contours are within normal limits. Both lungs are clear. The visualized skeletal structures are unremarkable. IMPRESSION: No active disease. Electronically Signed   By: Aram Candela M.D.   On: 05/27/2021 19:35    Procedures Procedures   Medications Ordered in ED Medications - No data to display  ED Course  I have reviewed the triage vital signs and the nursing notes.  Pertinent labs & imaging results that were available during my care of the patient were reviewed by me and considered in my medical decision making (see chart for details).  This patient presents to the Emergency Department with complaint of chest pain. This involves an extensive number of treatment options, and is a complaint that carries with it a high risk of complications and morbidity.  The differential diagnosis includes ACS vs Pneumothorax vs Reflux/Gastritis vs MSK pain vs Pneumonia vs other.  This appears most consistent with reflux.  Particularly the symptoms were relieved with Tums, and that he has a prior history of reflux symptoms.  We discussed initiating him on Pepcid moving forward.  I felt PE was less likely given that the symptoms are intermittent, he has no tachycardia or hypoxia, low risk for PE.  I ordered, reviewed, and interpreted labs, including BMP and CBC.  There were no immediate, life-threatening emergencies found in this labwork.  The patient's troponin level was 5 -> 6. I ordered imaging studies which included dg chest I independently visualized and interpreted imaging which showed no  focal infiltrates and the monitor tracing which showed NSR Previous records obtained and reviewed showing cardiology outpatient workup I personally reviewed the patients ECG which showed sinus rhythm with no acute ischemic findings  After the interventions stated above, I reevaluated the patient and found that they remained clinically stable.  HEART score of 2 (age, risk factor family history)  Based on the patient's clinical exam, vital signs, risk factors, and ED testing, I felt that the patient's overall risk of life-threatening emergency such as ACS, PE, sepsis, or infection was low.  At this time, I felt the patient's presentation was most clinically consistent with reflux, but explained to the patient that this evaluation was not a definitive diagnostic workup.  I discussed outpatient follow up with primary care provider, and provided specialist office number on the patient's discharge paper if a referral was deemed necessary.  Return precautions were discussed with the patient.  I felt the patient was clinically stable for discharge.   Clinical Course as of 05/28/21 1006  Sat May 27, 2021  2206 Blood pressure improved, 125/75, patient remains comfortable.  Advised reflux medications and precautions, cardiology f/u for office evaluation.  He verbalized understanding. [MT]    Clinical Course User Index [MT] Shraddha Lebron, Kermit Balo, MD    Final Clinical Impression(s) / ED Diagnoses Final diagnoses:  Chest discomfort    Rx / DC Orders ED Discharge Orders          Ordered    famotidine (PEPCID) 20 MG tablet  2 times daily        05/27/21 2206             Terald Sleeper, MD 05/28/21 1006

## 2021-05-29 DIAGNOSIS — Z23 Encounter for immunization: Secondary | ICD-10-CM | POA: Diagnosis not present

## 2021-08-18 NOTE — Progress Notes (Signed)
Cardiology Office Note:   Date:  08/22/2021  NAME:  Randy Nash    MRN: [id number] DOB:  1966/01/12   PCP:  Daisy Floro, MD  Cardiologist:  None  Electrophysiologist:  None   Referring MD: Daisy Floro, MD   Chief Complaint  Patient presents with   Follow-up   History of Present Illness:   Randy Nash is a 56 y.o. male with a hx of coronary calcium presents for follow-up.  Evaluated in 2021 for family history of heart disease.  Coronary calcium score of 16 which was 60th percentile.  Diet and exercise was recommended.  He presents for follow-up today. He reports he has been doing well.  Still doing a lot of activity.  He does bicycle riding 2 to 3 days/week.  He is writing up to 15 miles.  He reports some tightness in his chest with rides.  They can come and go.  He reports no real identifiable trigger.  Symptoms resolved with rest.  He is concerned that this may represent an underlying blockage.  Also describes intermittent tightness in his back.  Described as a burning sensation in his back.  He can still walk up to 3 miles per day but does get this intermittently.  His LDL cholesterol has increased to 149.  Remains on no medication.  BP values also have been largely within limits but has had high diastolic numbers.  BP today 142/90.  He is a little anxious this morning.  He does report stress and anxiety are an issue.  Father had heart disease as well as sister.  This still bothers him.  Calcium scoring in 2021 was in the 60th percentile.  Diet and exercise were recommended.  We discussed coronary CTA.  He is interested.  This will finally fully evaluate his coronary arteries to let us know if there is an issue.  Problem List 1. CAD -CAC score 16 (60th percentile) -T chol 216, HDL 56, LDL 149, TG 63  Past Medical History: Past Medical History:  Diagnosis Date   Anxiety    GERD (gastroesophageal reflux disease)    Headache     Past Surgical  History: Past Surgical History:  Procedure Laterality Date   GUM SURGERY      Current Medications: Current Meds  Medication Sig   metoprolol tartrate (LOPRESSOR) 100 MG tablet Take 1 tablet by mouth once for procedure.     Allergies:    Patient has no known allergies.   Social History: Social History   Socioeconomic History   Marital status: Married    Spouse name: elaine   Number of children: 3   Years of education: college   Highest education level: Not on file  Occupational History   Occupation: [employer] investment banker  Tobacco Use   Smoking status: Never   Smokeless tobacco: Never  Substance and Sexual Activity   Alcohol use: Yes    Alcohol/week: 0.0 standard drinks   Drug use: No   Sexual activity: Not on file  Other Topics Concern   Not on file  Social History Narrative   Not on file   Social Determinants of Health   Financial Resource Strain: Not on file  Food Insecurity: Not on file  Transportation Needs: Not on file  Physical Activity: Not on file  Stress: Not on file  Social Connections: Not on file     Family History: The patient's family history includes Anemia in his father; Cancer in his mother; Heart  attack in his father; Heart attack (age of onset: 25) in his sister; Heart failure (age of onset: 71) in his father.  ROS:   All other ROS reviewed and negative. Pertinent positives noted in the HPI.     EKGs/Labs/Other Studies Reviewed:   The following studies were personally reviewed by me today:  EKG:  EKG is ordered today.  The ekg ordered today demonstrates NSR 79 bpm, and was personally reviewed by me.   Recent Labs: 05/27/2021: BUN 15; Creatinine, Ser 1.09; Hemoglobin 13.8; Platelets 207; Potassium 3.7; Sodium 139   Recent Lipid Panel No results found for: CHOL, TRIG, HDL, CHOLHDL, VLDL, LDLCALC, LDLDIRECT  Physical Exam:   VS:  BP (!) 142/90 (BP Location: Right Arm, Patient Position: Sitting, Cuff Size: Large)    Pulse 79    Ht 5\' 8"  (1.727  m)    Wt 207 lb 12.8 oz (94.3 kg)    BMI 31.60 kg/m    Wt Readings from Last 3 Encounters:  08/22/21 207 lb 12.8 oz (94.3 kg)  05/27/21 200 lb (90.7 kg)  01/28/20 202 lb (91.6 kg)    General: Well nourished, well developed, in no acute distress Head: Atraumatic, normal size  Eyes: PEERLA, EOMI  Neck: Supple, no JVD Endocrine: No thryomegaly Cardiac: Normal S1, S2; RRR; no murmurs, rubs, or gallops Lungs: Clear to auscultation bilaterally, no wheezing, rhonchi or rales  Abd: Soft, nontender, no hepatomegaly  Ext: No edema, pulses 2+ Musculoskeletal: No deformities, BUE and BLE strength normal and equal Skin: Warm and dry, no rashes   Neuro: Alert and oriented to person, place, time, and situation, CNII-XII grossly intact, no focal deficits  Psych: Normal mood and affect   ASSESSMENT:   03/30/20 [name] is a 56 y.o. male who presents for the following: 1. Precordial pain   2. Family history of heart disease   3. Agatston coronary artery calcium score less than 100   4. Mixed hyperlipidemia   5. Elevated BP without diagnosis of hypertension     PLAN:   1. Precordial pain 2. Family history of heart disease 3. Agatston coronary artery calcium score less than 100 4. Mixed hyperlipidemia -Coronary calcium score was minimal in 2021.  This was in the 60th percentile.  Diet and exercise were recommended.  He did not want to take a statin agent.  Aspirin also not indicated at this level.  He continues to have episodes of intermittent chest tightness.  They really are likely noncardiac I suspect musculoskeletal.  However he does have a strong family history of heart disease in his father as well as sister.  For definitive evaluation of his coronary arteries we discussed coronary CTA.  He will need a BMP today as well as 100 mg of metoprolol titrate to heart before the scan.  His EKG in office continues to remain very normal.  I really have a low suspicion for underlying obstructive CAD but  we may have missed soft plaque on his calcium scoring.  We will set him up for a CTA and I will discuss the results with him by phone.  He will see me back in 1 year.  5. Elevated BP without diagnosis of hypertension -Blood pressure values are marginal.  He is still low risk.  Continue with diet and exercise.  Disposition: Return in about 1 year (around 08/22/2022).  Medication Adjustments/Labs and Tests Ordered: Current medicines are reviewed at length with the patient today.  Concerns regarding medicines are outlined above.  Orders Placed This Encounter  Procedures   CT CORONARY MORPH W/CTA COR W/SCORE W/CA W/CM &/OR WO/CM   Basic metabolic panel   EKG 12-Lead   Meds ordered this encounter  Medications   metoprolol tartrate (LOPRESSOR) 100 MG tablet    Sig: Take 1 tablet by mouth once for procedure.    Dispense:  1 tablet    Refill:  0    Patient Instructions  Medication Instructions:  Take Metoprolol 100 mg two hours before the CT scan when scheduled.   *If you need a refill on your cardiac medications before your next appointment, please call your pharmacy*   Lab Work: BMET (you can go up to the third floor here at [facility])   If you have labs (blood work) drawn today and your tests are completely normal, you will receive your results only by: MyChart Message (if you have MyChart) OR A paper copy in the mail If you have any lab test that is abnormal or we need to change your treatment, we will call you to review the results.   Testing/Procedures: Your physician has requested that you have cardiac CT. Cardiac computed tomography (CT) is a painless test that uses an x-ray machine to take clear, detailed pictures of your heart. For further information please visit [email or web address]. Please follow instruction sheet as given.   Follow-Up: At [facility], you and your health needs are our priority.  As part of our continuing mission to provide you with exceptional  heart care, we have created designated Provider Care Teams.  These Care Teams include your primary Cardiologist (physician) and Advanced Practice Providers (APPs -  Physician Assistants and Nurse Practitioners) who all work together to provide you with the care you need, when you need it.  We recommend signing up for the patient portal called "MyChart".  Sign up information is provided on this After Visit Summary.  MyChart is used to connect with patients for Virtual Visits (Telemedicine).  Patients are able to view lab/test results, encounter notes, upcoming appointments, etc.  Non-urgent messages can be sent to your provider as well.   To learn more about what you can do with MyChart, go to [email or web address].    Your next appointment:   12 month(s)  The format for your next appointment:   In Person  Provider:   Lennie OdorWesley O'Neal, MD    Other Instructions   Your cardiac CT will be scheduled at one of the below locations:   [facility] [address] [place], [place] [address] [phone]  If scheduled at [facility], please arrive at the [facility] main entrance (entrance A) of [facility]  30 minutes prior to test start time. You can use the FREE valet parking offered at the main entrance (encouraged to control the heart rate for the test) Proceed to the [facility] Radiology Department (first floor) to check-in and test prep.   Please follow these instructions carefully (unless otherwise directed):  Hold all erectile dysfunction medications at least 3 days (72 hrs) prior to test.  On the Night Before the Test: Be sure to Drink plenty of water. Do not consume any caffeinated/decaffeinated beverages or chocolate 12 hours prior to your test. Do not take any antihistamines 12 hours prior to your test.  On the Day of the Test: Drink plenty of water until 1 hour prior to the test. Do not eat any food 4 hours prior to the test. You may take your  regular medications prior  to the test.  Take metoprolol (Lopressor) two hours prior to test. HOLD Furosemide/Hydrochlorothiazide morning of the test. FEMALES- please wear underwire-free bra if available, avoid dresses & tight clothing  After the Test: Drink plenty of water. After receiving IV contrast, you may experience a mild flushed feeling. This is normal. On occasion, you may experience a mild rash up to 24 hours after the test. This is not dangerous. If this occurs, you can take Benadryl 25 mg and increase your fluid intake. If you experience trouble breathing, this can be serious. If it is severe call 911 IMMEDIATELY. If it is mild, please call our office. If you take any of these medications: Glipizide/Metformin, Avandament, Glucavance, please do not take 48 hours after completing test unless otherwise instructed.  We will call to schedule your test 2-4 weeks out understanding that some insurance companies will need an authorization prior to the service being performed.   For non-scheduling related questions, please contact the cardiac imaging nurse navigator should you have any questions/concerns: Rockwell AlexandriaSara Wallace, Cardiac Imaging Nurse Navigator Larey BrickMerle Prescott, Cardiac Imaging Nurse Navigator [facility] Direct Office Dial: [phone]   For scheduling needs, including cancellations and rescheduling, please call [place], [phone].   Time Spent with Patient: I have spent a total of 35 minutes with patient reviewing hospital notes, telemetry, EKGs, labs and examining the patient as well as establishing an assessment and plan that was discussed with the patient.  > 50% of time was spent in direct patient care.    Signed, Lenna GilfordWesley T. Flora Lipps'Neal, MD, [facility] [place]   [facility]  [address] [place], [place] [address] [phone]  08/22/2021 9:05 AM

## 2021-08-22 ENCOUNTER — Other Ambulatory Visit: Payer: Self-pay

## 2021-08-22 ENCOUNTER — Ambulatory Visit (INDEPENDENT_AMBULATORY_CARE_PROVIDER_SITE_OTHER): Payer: BC Managed Care – PPO | Admitting: Cardiovascular Disease

## 2021-08-22 ENCOUNTER — Encounter (HOSPITAL_BASED_OUTPATIENT_CLINIC_OR_DEPARTMENT_OTHER): Payer: Self-pay | Admitting: Cardiovascular Disease

## 2021-08-22 VITALS — BP 142/90 | HR 79 | Ht 68.0 in | Wt 207.8 lb

## 2021-08-22 DIAGNOSIS — R072 Precordial pain: Secondary | ICD-10-CM | POA: Diagnosis not present

## 2021-08-22 DIAGNOSIS — R030 Elevated blood-pressure reading, without diagnosis of hypertension: Secondary | ICD-10-CM

## 2021-08-22 DIAGNOSIS — Z8249 Family history of ischemic heart disease and other diseases of the circulatory system: Secondary | ICD-10-CM

## 2021-08-22 DIAGNOSIS — E782 Mixed hyperlipidemia: Secondary | ICD-10-CM

## 2021-08-22 DIAGNOSIS — R931 Abnormal findings on diagnostic imaging of heart and coronary circulation: Secondary | ICD-10-CM

## 2021-08-22 MED ORDER — METOPROLOL TARTRATE 100 MG PO TABS: ORAL_TABLET | ORAL | 0 refills | Status: DC

## 2021-08-22 NOTE — Patient Instructions (Signed)
Medication Instructions:  Take Metoprolol 100 mg two hours before the CT scan when scheduled.   *If you need a refill on your cardiac medications before your next appointment, please call your pharmacy*   Lab Work: BMET (you can go up to the third floor here at [facility])   If you have labs (blood work) drawn today and your tests are completely normal, you will receive your results only by: MyChart Message (if you have MyChart) OR A paper copy in the mail If you have any lab test that is abnormal or we need to change your treatment, we will call you to review the results.   Testing/Procedures: Your physician has requested that you have cardiac CT. Cardiac computed tomography (CT) is a painless test that uses an x-ray machine to take clear, detailed pictures of your heart. For further information please visit [email or web address] Please follow instruction sheet as given.   Follow-Up: At [facility], you and your health needs are our priority.  As part of our continuing mission to provide you with exceptional heart care, we have created designated Provider Care Teams.  These Care Teams include your primary Cardiologist (physician) and Advanced Practice Providers (APPs -  Physician Assistants and Nurse Practitioners) who all work together to provide you with the care you need, when you need it.  We recommend signing up for the patient portal called "MyChart".  Sign up information is provided on this After Visit Summary.  MyChart is used to connect with patients for Virtual Visits (Telemedicine).  Patients are able to view lab/test results, encounter notes, upcoming appointments, etc.  Non-urgent messages can be sent to your provider as well.   To learn more about what you can do with MyChart, go to [email or web address].    Your next appointment:   12 month(s)  The format for your next appointment:   In Person  Provider:   Lennie Odor, MD    Other Instructions   Your  cardiac CT will be scheduled at one of the below locations:   [facility] [address] [place], [place] [address] [phone]  If scheduled at [facility], please arrive at the [facility] main entrance (entrance A) of [facility] 30 minutes prior to test start time. You can use the FREE valet parking offered at the main entrance (encouraged to control the heart rate for the test) Proceed to the [facility] Radiology Department (first floor) to check-in and test prep.   Please follow these instructions carefully (unless otherwise directed):  Hold all erectile dysfunction medications at least 3 days (72 hrs) prior to test.  On the Night Before the Test: Be sure to Drink plenty of water. Do not consume any caffeinated/decaffeinated beverages or chocolate 12 hours prior to your test. Do not take any antihistamines 12 hours prior to your test.  On the Day of the Test: Drink plenty of water until 1 hour prior to the test. Do not eat any food 4 hours prior to the test. You may take your regular medications prior to the test.  Take metoprolol (Lopressor) two hours prior to test. HOLD Furosemide/Hydrochlorothiazide morning of the test. FEMALES- please wear underwire-free bra if available, avoid dresses & tight clothing  After the Test: Drink plenty of water. After receiving IV contrast, you may experience a mild flushed feeling. This is normal. On occasion, you may experience a mild rash up to 24 hours after the test. This is not dangerous. If this occurs, you  can take Benadryl 25 mg and increase your fluid intake. If you experience trouble breathing, this can be serious. If it is severe call 911 IMMEDIATELY. If it is mild, please call our office. If you take any of these medications: Glipizide/Metformin, Avandament, Glucavance, please do not take 48 hours after completing test unless otherwise instructed.  We will call to schedule your test 2-4 weeks out  understanding that some insurance companies will need an authorization prior to the service being performed.   For non-scheduling related questions, please contact the cardiac imaging nurse navigator should you have any questions/concerns: Rockwell Alexandria, Cardiac Imaging Nurse Navigator Larey Brick, Cardiac Imaging Nurse Navigator [facility] Direct Office Dial: [phone]   For scheduling needs, including cancellations and rescheduling, please call [place], [phone].

## 2021-08-23 LAB — BASIC METABOLIC PANEL
BUN/Creatinine Ratio: 17 (ref 9–20)
BUN: 17 mg/dL (ref 6–24)
CO2: 24 mmol/L (ref 20–29)
Calcium: 9.5 mg/dL (ref 8.7–10.2)
Chloride: 104 mmol/L (ref 96–106)
Creatinine, Ser: 0.99 mg/dL (ref 0.76–1.27)
Glucose: 102 mg/dL — ABNORMAL HIGH (ref 70–99)
Potassium: 5 mmol/L (ref 3.5–5.2)
Sodium: 143 mmol/L (ref 134–144)
eGFR: 90 mL/min/{1.73_m2} (ref >59)

## 2021-08-30 ENCOUNTER — Telehealth (HOSPITAL_COMMUNITY): Payer: Self-pay | Admitting: Emergency Medicine

## 2021-08-30 NOTE — Telephone Encounter (Signed)
Attempted to call patient regarding upcoming cardiac CT appointment. °Left message on voicemail with name and callback number °Jil Penland RN Navigator Cardiac Imaging °[facility] °[phone] Office °[phone] Cell ° °

## 2021-08-31 ENCOUNTER — Telehealth (HOSPITAL_COMMUNITY): Payer: Self-pay | Admitting: Emergency Medicine

## 2021-08-31 NOTE — Telephone Encounter (Signed)
Reaching out to patient to offer assistance regarding upcoming cardiac imaging study; pt verbalizes understanding of appt date/time, parking situation and where to check in, pre-test NPO status and medications ordered, and verified current allergies; name and call back number provided for further questions should they arise Rockwell Alexandria RN Navigator Cardiac Imaging [facility] [phone] office [phone] cell  100mg  metoprolol  Denies IV issues Arrival 730

## 2021-09-01 ENCOUNTER — Encounter (HOSPITAL_COMMUNITY): Payer: Self-pay

## 2021-09-01 ENCOUNTER — Ambulatory Visit (HOSPITAL_COMMUNITY)
Admission: RE | Admit: 2021-09-01 | Discharge: 2021-09-01 | Disposition: A | Discharge status: Home / Self Care | Payer: BC Managed Care – PPO | Source: Ambulatory Visit | Attending: Cardiovascular Disease | Admitting: Cardiovascular Disease

## 2021-09-01 ENCOUNTER — Other Ambulatory Visit: Payer: Self-pay

## 2021-09-01 DIAGNOSIS — R072 Precordial pain: Secondary | ICD-10-CM | POA: Diagnosis not present

## 2021-09-01 MED ORDER — NITROGLYCERIN 0.4 MG SL SUBL: 0.8000 mg | SUBLINGUAL_TABLET | Freq: Once | SUBLINGUAL | Status: AC

## 2021-09-01 MED ORDER — IOHEXOL 350 MG/ML SOLN
95.0000 mL | Freq: Once | INTRAVENOUS | Status: AC | PRN
  Administered 2021-09-01: 95 mL via INTRAVENOUS

## 2021-09-01 MED ORDER — NITROGLYCERIN 0.4 MG SL SUBL
SUBLINGUAL_TABLET | SUBLINGUAL | Status: AC
  Administered 2021-09-01: 0.8 mg via SUBLINGUAL
  Filled 2021-09-01: qty 2

## 2021-09-04 ENCOUNTER — Encounter: Payer: Self-pay | Admitting: Cardiovascular Disease

## 2022-01-05 DIAGNOSIS — Z0000 Encounter for general adult medical examination without abnormal findings: Secondary | ICD-10-CM | POA: Diagnosis not present

## 2022-01-05 DIAGNOSIS — Z13220 Encounter for screening for lipoid disorders: Secondary | ICD-10-CM | POA: Diagnosis not present

## 2022-01-05 DIAGNOSIS — Z125 Encounter for screening for malignant neoplasm of prostate: Secondary | ICD-10-CM | POA: Diagnosis not present

## 2022-01-12 DIAGNOSIS — Z0000 Encounter for general adult medical examination without abnormal findings: Secondary | ICD-10-CM | POA: Diagnosis not present

## 2022-05-13 ENCOUNTER — Other Ambulatory Visit: Payer: Self-pay

## 2022-05-13 ENCOUNTER — Emergency Department (HOSPITAL_BASED_OUTPATIENT_CLINIC_OR_DEPARTMENT_OTHER)
Admission: EM | Admit: 2022-05-13 | Discharge: 2022-05-14 | Discharge status: Against Medical Advice | Payer: BC Managed Care – PPO | Attending: Emergency Medicine | Admitting: Emergency Medicine

## 2022-05-13 DIAGNOSIS — Z5321 Procedure and treatment not carried out due to patient leaving prior to being seen by health care provider: Secondary | ICD-10-CM | POA: Diagnosis not present

## 2022-05-13 DIAGNOSIS — I10 Essential (primary) hypertension: Secondary | ICD-10-CM | POA: Diagnosis not present

## 2022-05-14 ENCOUNTER — Encounter (HOSPITAL_BASED_OUTPATIENT_CLINIC_OR_DEPARTMENT_OTHER): Payer: Self-pay

## 2022-05-14 NOTE — ED Triage Notes (Signed)
Pt presents to the ED for hypertension. States that he has taken his BP about 12 times today because he got a high reading. States that the highest reading he got at home was 189/108. Pt states that he has been watching his BP off and on for about 3-4 years r/t occasional higher readings. BP at time of triage 138/95.

## 2022-09-23 NOTE — Progress Notes (Unsigned)
Cardiology Office Note:   Date:  09/24/2022  NAME:  Randy Nash    MRN: [id number] DOB:  July 03, 1966   PCP:  Lawerance Cruel, MD  Cardiologist:  None  Electrophysiologist:  None   Referring MD: Lawerance Cruel, MD   Chief Complaint  Patient presents with   Follow-up        History of Present Illness:   Randy Fynn Reves is a 57 y.o. male with a hx of non-obstructive CAD who presents for follow-up.  He reports he is doing well.  Denies any chest pain or trouble breathing.  Walking up to 4 miles per exercise session.  Working on his diet.  BMI 32.  Working on losing weight.  Blood pressure 138/82.  Watching this at home.  Values seem to be within limits.  We did discuss his calcium score was at the 63rd percentile.  Would recommend LDL lowering agents.  No significant blockages on coronary CTA.  He is interested in trying a statin.  Concerned about side effects.  We discussed these in office today.   Problem List 1. CAD -CAC score 26.5 (63rd percentile) -T chol 165, HDL 48, LDL 104, TG 68  Past Medical History: Past Medical History:  Diagnosis Date   Anxiety    GERD (gastroesophageal reflux disease)    Headache    Hyperlipidemia     Past Surgical History: Past Surgical History:  Procedure Laterality Date   GUM SURGERY      Current Medications: Current Meds  Medication Sig   Ascorbic Acid (VITAMIN C) 100 MG CHEW Chew 1 tablet by mouth daily.   B-Complex TABS Take 1 tablet by mouth daily.   clonazePAM (KLONOPIN) 0.5 MG tablet Take 0.5 mg by mouth at bedtime.   Ginseng (ELEUTHERO PO) Take 2 capsules by mouth 2 (two) times daily.   magnesium oxide (MAG-OX) 400 MG tablet Take 400 mg by mouth daily.   Multiple Vitamins-Minerals (ZINC PO) Take by mouth.   rosuvastatin (CRESTOR) 10 MG tablet Take 1 tablet (10 mg total) by mouth daily.   Vitamin E 100 UNITS TABS Take 1 tablet by mouth daily.     Allergies:    Patient has no known allergies.   Social  History: Social History   Socioeconomic History   Marital status: Married    Spouse name: elaine   Number of children: 3   Years of education: college   Highest education level: Not on file  Occupational History   Occupation: B/E Event organiser  Tobacco Use   Smoking status: Never   Smokeless tobacco: Never  Substance and Sexual Activity   Alcohol use: Yes    Alcohol/week: 0.0 standard drinks of alcohol   Drug use: No   Sexual activity: Not on file  Other Topics Concern   Not on file  Social History Narrative   Not on file   Social Determinants of Health   Financial Resource Strain: Not on file  Food Insecurity: Not on file  Transportation Needs: Not on file  Physical Activity: Not on file  Stress: Not on file  Social Connections: Not on file     Family History: The patient's family history includes Anemia in his father; Cancer in his mother; Heart attack in his father; Heart attack (age of onset: 40) in his sister; Heart failure (age of onset: 12) in his father.  ROS:   All other ROS reviewed and negative. Pertinent positives noted in the HPI.     EKGs/Labs/Other  Studies Reviewed:   The following studies were personally reviewed by me today:   CCTA 09/01/2021  IMPRESSION: 1. Coronary calcium score of 26.5. This was 63rd percentile for age-, sex, and race-matched controls.   2.  Normal coronary origin with right dominance.   3.  Normal coronary arteries.  CAD RADS 1.   4.  Consider non cardiac etiologies of chest pain.  Recent Labs: No results found for requested labs within last 365 days.   Recent Lipid Panel No results found for: "CHOL", "TRIG", "HDL", "CHOLHDL", "VLDL", "LDLCALC", "LDLDIRECT"  Physical Exam:   VS:  BP 138/82   Pulse 80   Ht '5\' 8"'$  (1.727 m)   Wt 208 lb 3.2 oz (94.4 kg)   SpO2 97%   BMI 31.66 kg/m    Wt Readings from Last 3 Encounters:  09/24/22 208 lb 3.2 oz (94.4 kg)  08/22/21 207 lb 12.8 oz (94.3 kg)  05/27/21 200 lb (90.7 kg)     General: Well nourished, well developed, in no acute distress Head: Atraumatic, normal size  Eyes: PEERLA, EOMI  Neck: Supple, no JVD Endocrine: No thryomegaly Cardiac: Normal S1, S2; RRR; no murmurs, rubs, or gallops Lungs: Clear to auscultation bilaterally, no wheezing, rhonchi or rales  Abd: Soft, nontender, no hepatomegaly  Ext: No edema, pulses 2+ Musculoskeletal: No deformities, BUE and BLE strength normal and equal Skin: Warm and dry, no rashes   Neuro: Alert and oriented to person, place, time, and situation, CNII-XII grossly intact, no focal deficits  Psych: Normal mood and affect   ASSESSMENT:   Randy Eleno Vaden is a 57 y.o. male who presents for the following: 1. Agatston coronary artery calcium score less than 100   2. Mixed hyperlipidemia     PLAN:   1. Agatston coronary artery calcium score less than 100 2. Mixed hyperlipidemia -Coronary calcium score in the 63rd percentile.  Not that elevated.  I do not believe he needs aspirin.  I would recommend Crestor 10 mg daily.  Would aim for an LDL cholesterol less than 70 given family history.  As long it is less than 100 I think this is acceptable.  We discussed healthy lifestyle modifications.  He is exercising.  Working on his diet.  He will see [place] yearly.  Denies any concerning symptoms for angina.  Disposition: Return in about 1 year (around 09/25/2023).  Medication Adjustments/Labs and Tests Ordered: Current medicines are reviewed at length with the patient today.  Concerns regarding medicines are outlined above.  No orders of the defined types were placed in this encounter.  Meds ordered this encounter  Medications   rosuvastatin (CRESTOR) 10 MG tablet    Sig: Take 1 tablet (10 mg total) by mouth daily.    Dispense:  90 tablet    Refill:  3    Patient Instructions  Medication Instructions:  START Crestor 10 mg daily   *If you need a refill on your cardiac medications before your next appointment, please  call your pharmacy*   Lab Work: Have primary care provider send over labs when completed.   Follow-Up: At [facility], you and your health needs are our priority.  As part of our continuing mission to provide you with exceptional heart care, we have created designated Provider Care Teams.  These Care Teams include your primary Cardiologist (physician) and Advanced Practice Providers (APPs -  Physician Assistants and Nurse Practitioners) who all work together to provide you with the care you need, when you  need it.  We recommend signing up for the patient portal called "MyChart".  Sign up information is provided on this After Visit Summary.  MyChart is used to connect with patients for Virtual Visits (Telemedicine).  Patients are able to view lab/test results, encounter notes, upcoming appointments, etc.  Non-urgent messages can be sent to your provider as well.   To learn more about what you can do with MyChart, go to [email or web address].    Your next appointment:   12 month(s)  Provider:   Eleonore Chiquito, MD    Time Spent with Patient: I have spent a total of 25 minutes with patient reviewing hospital notes, telemetry, EKGs, labs and examining the patient as well as establishing an assessment and plan that was discussed with the patient.  > 50% of time was spent in direct patient care.  Signed, Addison Naegeli. Audie Box, MD, [place]  [address] [place], [place] [address] [phone]  09/24/2022 9:16 AM

## 2022-09-24 ENCOUNTER — Ambulatory Visit: Payer: BC Managed Care – PPO | Attending: Cardiovascular Disease | Admitting: Cardiovascular Disease

## 2022-09-24 ENCOUNTER — Encounter: Payer: Self-pay | Admitting: Cardiovascular Disease

## 2022-09-24 VITALS — BP 138/82 | HR 80 | Ht 68.0 in | Wt 208.2 lb

## 2022-09-24 DIAGNOSIS — R931 Abnormal findings on diagnostic imaging of heart and coronary circulation: Secondary | ICD-10-CM

## 2022-09-24 DIAGNOSIS — E782 Mixed hyperlipidemia: Secondary | ICD-10-CM | POA: Diagnosis not present

## 2022-09-24 MED ORDER — ROSUVASTATIN CALCIUM 10 MG PO TABS: 10.0000 mg | ORAL_TABLET | Freq: Every day | ORAL | 3 refills | Status: DC

## 2022-09-24 NOTE — Patient Instructions (Signed)
Medication Instructions:  START Crestor 10 mg daily   *If you need a refill on your cardiac medications before your next appointment, please call your pharmacy*   Lab Work: Have primary care provider send over labs when completed.   Follow-Up: At [facility], you and your health needs are our priority.  As part of our continuing mission to provide you with exceptional heart care, we have created designated Provider Care Teams.  These Care Teams include your primary Cardiologist (physician) and Advanced Practice Providers (APPs -  Physician Assistants and Nurse Practitioners) who all work together to provide you with the care you need, when you need it.  We recommend signing up for the patient portal called "MyChart".  Sign up information is provided on this After Visit Summary.  MyChart is used to connect with patients for Virtual Visits (Telemedicine).  Patients are able to view lab/test results, encounter notes, upcoming appointments, etc.  Non-urgent messages can be sent to your provider as well.   To learn more about what you can do with MyChart, go to [email or web address].    Your next appointment:   12 month(s)  Provider:   Eleonore Chiquito, MD

## 2022-10-16 DIAGNOSIS — T1501XA Foreign body in cornea, right eye, initial encounter: Secondary | ICD-10-CM | POA: Diagnosis not present

## 2023-01-24 ENCOUNTER — Encounter: Payer: Self-pay | Admitting: Cardiovascular Disease

## 2023-01-24 MED ORDER — ATORVASTATIN CALCIUM 20 MG PO TABS: 20.0000 mg | ORAL_TABLET | Freq: Every day | ORAL | 3 refills | Status: DC

## 2023-03-11 DIAGNOSIS — Z125 Encounter for screening for malignant neoplasm of prostate: Secondary | ICD-10-CM | POA: Diagnosis not present

## 2023-03-11 DIAGNOSIS — R7301 Impaired fasting glucose: Secondary | ICD-10-CM | POA: Diagnosis not present

## 2023-03-11 DIAGNOSIS — Z0000 Encounter for general adult medical examination without abnormal findings: Secondary | ICD-10-CM | POA: Diagnosis not present

## 2023-03-11 DIAGNOSIS — Z13220 Encounter for screening for lipoid disorders: Secondary | ICD-10-CM | POA: Diagnosis not present

## 2023-03-18 DIAGNOSIS — R7301 Impaired fasting glucose: Secondary | ICD-10-CM | POA: Diagnosis not present

## 2023-03-18 DIAGNOSIS — F411 Generalized anxiety disorder: Secondary | ICD-10-CM | POA: Diagnosis not present

## 2023-03-18 DIAGNOSIS — Z0000 Encounter for general adult medical examination without abnormal findings: Secondary | ICD-10-CM | POA: Diagnosis not present

## 2023-03-18 DIAGNOSIS — R972 Elevated prostate specific antigen [PSA]: Secondary | ICD-10-CM | POA: Diagnosis not present

## 2023-03-18 DIAGNOSIS — J4520 Mild intermittent asthma, uncomplicated: Secondary | ICD-10-CM | POA: Diagnosis not present

## 2023-05-01 DIAGNOSIS — K7689 Other specified diseases of liver: Secondary | ICD-10-CM | POA: Diagnosis not present

## 2023-05-01 DIAGNOSIS — R972 Elevated prostate specific antigen [PSA]: Secondary | ICD-10-CM | POA: Diagnosis not present

## 2023-05-24 DIAGNOSIS — Z1211 Encounter for screening for malignant neoplasm of colon: Secondary | ICD-10-CM | POA: Diagnosis not present

## 2023-05-24 DIAGNOSIS — Z1212 Encounter for screening for malignant neoplasm of rectum: Secondary | ICD-10-CM | POA: Diagnosis not present

## 2023-06-24 DIAGNOSIS — R059 Cough, unspecified: Secondary | ICD-10-CM | POA: Diagnosis not present

## 2023-07-15 DIAGNOSIS — J069 Acute upper respiratory infection, unspecified: Secondary | ICD-10-CM | POA: Diagnosis not present

## 2023-07-15 DIAGNOSIS — Z20828 Contact with and (suspected) exposure to other viral communicable diseases: Secondary | ICD-10-CM | POA: Diagnosis not present

## 2024-03-16 ENCOUNTER — Other Ambulatory Visit: Payer: Self-pay | Admitting: Cardiovascular Disease

## 2024-03-19 DIAGNOSIS — Z0000 Encounter for general adult medical examination without abnormal findings: Secondary | ICD-10-CM | POA: Diagnosis not present

## 2024-03-19 DIAGNOSIS — J4520 Mild intermittent asthma, uncomplicated: Secondary | ICD-10-CM | POA: Diagnosis not present

## 2024-03-19 DIAGNOSIS — E7800 Pure hypercholesterolemia, unspecified: Secondary | ICD-10-CM | POA: Diagnosis not present

## 2024-03-30 NOTE — Progress Notes (Unsigned)
 Cardiology Office Note:    Date:  04/02/2024   ID:  Randy Kenneth Havens, DOB 01-30-1966, MRN [id number]  PCP:  Okey Carlin Redbird, MD   [facility] Providers Cardiologist:  Darryle ONEIDA Decent, MD     Referring MD: Okey Carlin Redbird, MD   Chief complaint: Yearly follow-up  History of Present Illness:    Randy Nash is a 58 y.o. male with a hx of atypical chest pain.  2019: Seen by Dr. Alveta for atypical chest pain while cycling.  Exercise testing was ordered, resulted as a normal study, clinically negative for chest pain, with no evidence of ischemia by ST analysis.  2021: CAC score 16, 60th percentile.  No symptoms of chest pain.  EKG normal.  Plan for yearly follow-up.  2022: Presented to the ED with chest pain/tightness following dinner. Troponins were negative, chest x-ray unremarkable, EKG normal sinus rhythm.  Symptoms appeared to be consistent with reflux and relieved with Tums.  2023: Patient reported chest tightness with bike rides, resolved with rest, felt as a burning sensation in his back, patient concerned for blockage, coronary CTA ordered.  CTA on 09/01/2021 showed coronary calcium  score of 26.5, 63rd percentile, with mild nonobstructive CAD.  2024: Denied episodes of chest pain or trouble breathing.  Discussed lipid-lowering agents, was interested in a statin at this time, LDL goal <70, started on Crestor  10 mg orally daily, began having back pain, was switched to Lipitor 20 mg daily.  Presents to the clinic today doing well from a cardiovascular standpoint.  Denies chest pain, shortness of breath, palpitations, dizziness, lightheadedness, near-syncope, nausea, vomiting, bloody/dark/tarry stools, lower extremity edema, claudication.  When asked about previous episodes of chest pain, patient states that he does have a history of anxiety and GERD and believes those factors were likely because of his previous episodes.  Patient bikes an average of 20 miles per  riding, which was long with ride this year being 50 miles, states he would do more on average but just does not have time.  States when he does ride his bike he occasionally feels overall short of breath with some tightness in his throat towards the end of the ride, has recently been relieved with an albuterol inhaler.  Past Medical History:  Diagnosis Date   Anxiety    GERD (gastroesophageal reflux disease)    Headache    Hyperlipidemia     Past Surgical History:  Procedure Laterality Date   GUM SURGERY      Current Medications: Current Meds  Medication Sig   Ascorbic Acid (VITAMIN C) 100 MG CHEW Chew 1 tablet by mouth daily.   aspirin  EC 81 MG tablet Take 1 tablet (81 mg total) by mouth daily. Swallow whole.   atorvastatin  (LIPITOR) 20 MG tablet TAKE 1 TABLET BY MOUTH EVERY DAY   B-Complex TABS Take 1 tablet by mouth daily.   clonazePAM (KLONOPIN) 0.5 MG tablet Take 0.5 mg by mouth at bedtime.   famotidine  (PEPCID ) 20 MG tablet Take 1 tablet (20 mg total) by mouth 2 (two) times daily. Take 30 minutes before breakfast and dinner   Ginseng (ELEUTHERO PO) Take 2 capsules by mouth 2 (two) times daily.   magnesium oxide (MAG-OX) 400 MG tablet Take 400 mg by mouth daily.   Multiple Vitamins-Minerals (ZINC PO) Take by mouth.   Vitamin E 100 UNITS TABS Take 1 tablet by mouth daily.     Allergies:   Patient has no known allergies.   Social History  Socioeconomic History   Marital status: Married    Spouse name: elaine   Number of children: 3   Years of education: college   Highest education level: Not on file  Occupational History   Occupation: B/E Corporate investment banker  Tobacco Use   Smoking status: Never   Smokeless tobacco: Never  Substance and Sexual Activity   Alcohol use: Yes    Alcohol/week: 0.0 standard drinks of alcohol   Drug use: No   Sexual activity: Not on file  Other Topics Concern   Not on file  Social History Narrative   Not on file   Social Drivers of Health    Financial Resource Strain: Not on file  Food Insecurity: Not on file  Transportation Needs: Not on file  Physical Activity: Not on file  Stress: Not on file  Social Connections: Not on file     Family History: The patient's family history includes Anemia in his father; Cancer in his mother; Heart attack in his father; Heart attack (age of onset: 43) in his sister; Heart failure (age of onset: 57) in his father.  ROS:   Please see the history of present illness.    All other systems reviewed and are negative.  EKGs/Labs/Other Studies Reviewed:    The following studies were reviewed today:  EKG Interpretation Date/Time:  Thursday April 02 2024 08:05:33 EDT Ventricular Rate:  69 PR Interval:  178 QRS Duration:  92 QT Interval:  396 QTC Calculation: 424 R Axis:   56  Text Interpretation: Normal sinus rhythm Normal ECG Confirmed by Taylor Spilde [id number]) on 04/02/2024 8:28:33 AM    Recent Labs: No results found for requested labs within last 365 days.  Recent Lipid Panel No results found for: CHOL, TRIG, HDL, CHOLHDL, VLDL, LDLCALC, LDLDIRECT   Physical Exam:    VS:  BP 120/82   Pulse 64   Ht 5' 9 (1.753 m)   Wt 209 lb (94.8 kg)   SpO2 95%   BMI 30.86 kg/m     Wt Readings from Last 3 Encounters:  04/02/24 209 lb (94.8 kg)  09/24/22 208 lb 3.2 oz (94.4 kg)  08/22/21 207 lb 12.8 oz (94.3 kg)     GEN:  Well nourished, well developed in no acute distress HEENT: Normal NECK: No carotid bruits CARDIAC: S1-S2 normal, RRR, no murmurs, rubs, gallops RESPIRATORY:  Clear to auscultation without rales, wheezing or rhonchi  MUSCULOSKELETAL:  No edema; No deformity  SKIN: Warm and dry NEUROLOGIC:  Alert and oriented x 3 PSYCHIATRIC:  Normal affect   ASSESSMENT:    1. Agatston coronary artery calcium  score less than 100   2. Hyperlipidemia LDL goal <70    PLAN:    In order of problems listed above:  Coronary calcium  score of 26.5  EKG: NSR 69  BPM in office today  Denies CP, SOB, palpitations, nausea, leg swelling  Considering previous coronary calcium  score of 26.5, 63rd percentile, will prescribe aspirin   Start aspirin  EC 81 mg daily  HLD, LDL goal <70  PCP manages  03/16/2024: chol 130, hdl 51,trig 74, ldl 64, AST 26, ALT 29  Continue atorvastatin  20 mg daily  Follow up with Dr. Barbaraann in 1 year or sooner if symptoms change or new ones develop   Medication Adjustments/Labs and Tests Ordered: Current medicines are reviewed at length with the patient today.  Concerns regarding medicines are outlined above.  Orders Placed This Encounter  Procedures   EKG 12-Lead   Meds ordered this encounter  Medications   aspirin  EC 81 MG tablet    Sig: Take 1 tablet (81 mg total) by mouth daily. Swallow whole.    Patient Instructions  Medication Instructions:  Your physician has recommended you make the following change in your medication:   ** Begin  enteric coated Aspirin  81mg  - 1 tablet by mouth daily.  *If you need a refill on your cardiac medications before your next appointment, please call your pharmacy*  Lab Work: None ordered.  If you have labs (blood work) drawn today and your tests are completely normal, you will receive your results only by: MyChart Message (if you have MyChart) OR A paper copy in the mail If you have any lab test that is abnormal or we need to change your treatment, we will call you to review the results.  Testing/Procedures: None ordered.   Follow-Up: At [facility], you and your health needs are our priority.  As part of our continuing mission to provide you with exceptional heart care, our providers are all part of one team.  This team includes your primary Cardiologist (physician) and Advanced Practice Providers or APPs (Physician Assistants and Nurse Practitioners) who all work together to provide you with the care you need, when you need it.  Your next appointment:   12 months  with Dr Barbaraann       Signed, Miriam FORBES Shams, NP  04/02/2024 9:17 AM    [facility]

## 2024-03-31 ENCOUNTER — Encounter (HOSPITAL_BASED_OUTPATIENT_CLINIC_OR_DEPARTMENT_OTHER): Payer: Self-pay

## 2024-04-02 ENCOUNTER — Ambulatory Visit: Attending: Physician Assistant | Admitting: Emergency Medicine

## 2024-04-02 ENCOUNTER — Encounter: Payer: Self-pay | Admitting: Emergency Medicine

## 2024-04-02 VITALS — BP 120/82 | HR 64 | Ht 69.0 in | Wt 209.0 lb

## 2024-04-02 DIAGNOSIS — R931 Abnormal findings on diagnostic imaging of heart and coronary circulation: Secondary | ICD-10-CM | POA: Diagnosis not present

## 2024-04-02 DIAGNOSIS — E785 Hyperlipidemia, unspecified: Secondary | ICD-10-CM | POA: Diagnosis not present

## 2024-04-02 MED ORDER — ASPIRIN 81 MG PO TBEC: 81.0000 mg | DELAYED_RELEASE_TABLET | Freq: Every day | ORAL | Status: AC

## 2024-04-02 NOTE — Patient Instructions (Signed)
 Medication Instructions:  Your physician has recommended you make the following change in your medication:   ** Begin  enteric coated Aspirin  81mg  - 1 tablet by mouth daily.  *If you need a refill on your cardiac medications before your next appointment, please call your pharmacy*  Lab Work: None ordered.  If you have labs (blood work) drawn today and your tests are completely normal, you will receive your results only by: MyChart Message (if you have MyChart) OR A paper copy in the mail If you have any lab test that is abnormal or we need to change your treatment, we will call you to review the results.  Testing/Procedures: None ordered.   Follow-Up: At [facility], you and your health needs are our priority.  As part of our continuing mission to provide you with exceptional heart care, our providers are all part of one team.  This team includes your primary Cardiologist (physician) and Advanced Practice Providers or APPs (Physician Assistants and Nurse Practitioners) who all work together to provide you with the care you need, when you need it.  Your next appointment:   12 months with Dr Barbaraann
